# Patient Record
Sex: Female | Born: 1965 | Race: Black or African American | Hispanic: No | Marital: Single | Smoking: Former smoker
Health system: Southern US, Community
[De-identification: ages and names within clinical notes are randomized; demographics above are authoritative.]

## PROBLEM LIST (undated history)

## (undated) DIAGNOSIS — I10 Essential (primary) hypertension: Secondary | ICD-10-CM

## (undated) DIAGNOSIS — I639 Cerebral infarction, unspecified: Secondary | ICD-10-CM

## (undated) DIAGNOSIS — I219 Acute myocardial infarction, unspecified: Secondary | ICD-10-CM

## (undated) DIAGNOSIS — K219 Gastro-esophageal reflux disease without esophagitis: Secondary | ICD-10-CM

## (undated) DIAGNOSIS — M1990 Unspecified osteoarthritis, unspecified site: Secondary | ICD-10-CM

## (undated) HISTORY — PX: OTHER SURGICAL HISTORY: SHX169

## (undated) HISTORY — PX: TUBAL LIGATION: SHX77

---

## 2011-07-25 ENCOUNTER — Emergency Department: Payer: Self-pay | Admitting: Emergency Medicine

## 2014-04-13 ENCOUNTER — Emergency Department: Payer: Self-pay | Admitting: Emergency Medicine

## 2014-04-13 LAB — BASIC METABOLIC PANEL
Anion Gap: 8 (ref 7–16)
BUN: 10 mg/dL (ref 7–18)
CALCIUM: 9 mg/dL (ref 8.5–10.1)
CHLORIDE: 106 mmol/L (ref 98–107)
CREATININE: 0.93 mg/dL (ref 0.60–1.30)
Co2: 23 mmol/L (ref 21–32)
EGFR (Non-African Amer.): >60
Glucose: 96 mg/dL (ref 65–99)
Osmolality: 273 (ref 275–301)
Potassium: 4.2 mmol/L (ref 3.5–5.1)
Sodium: 137 mmol/L (ref 136–145)

## 2014-04-13 LAB — TROPONIN I
Troponin-I: <0.02 ng/mL
Troponin-I: <0.02 ng/mL

## 2014-04-13 LAB — CBC
HCT: 37.9 % (ref 35.0–47.0)
HGB: 12.3 g/dL (ref 12.0–16.0)
MCH: 30 pg (ref 26.0–34.0)
MCHC: 32.4 g/dL (ref 32.0–36.0)
MCV: 93 fL (ref 80–100)
PLATELETS: 391 10*3/uL (ref 150–440)
RBC: 4.1 10*6/uL (ref 3.80–5.20)
RDW: 14 % (ref 11.5–14.5)
WBC: 11.5 10*3/uL — AB (ref 3.6–11.0)

## 2014-05-12 ENCOUNTER — Emergency Department: Payer: Self-pay | Admitting: Emergency Medicine

## 2014-05-12 ENCOUNTER — Other Ambulatory Visit: Payer: Self-pay

## 2014-05-12 LAB — COMPREHENSIVE METABOLIC PANEL
ALBUMIN: 3.5 g/dL (ref 3.4–5.0)
ANION GAP: 6 — AB (ref 7–16)
AST: 21 U/L (ref 15–37)
Alkaline Phosphatase: 113 U/L
BUN: 14 mg/dL (ref 7–18)
Bilirubin,Total: 0.2 mg/dL (ref 0.2–1.0)
CO2: 29 mmol/L (ref 21–32)
Calcium, Total: 9 mg/dL (ref 8.5–10.1)
Chloride: 104 mmol/L (ref 98–107)
Creatinine: 0.9 mg/dL (ref 0.60–1.30)
EGFR (African American): >60
EGFR (Non-African Amer.): >60
GLUCOSE: 94 mg/dL (ref 65–99)
OSMOLALITY: 278 (ref 275–301)
Potassium: 3.9 mmol/L (ref 3.5–5.1)
SGPT (ALT): 15 U/L
Sodium: 139 mmol/L (ref 136–145)
Total Protein: 7.8 g/dL (ref 6.4–8.2)

## 2014-05-12 LAB — CBC
HCT: 33.7 % — ABNORMAL LOW (ref 35.0–47.0)
HGB: 10.8 g/dL — ABNORMAL LOW (ref 12.0–16.0)
MCH: 29.1 pg (ref 26.0–34.0)
MCHC: 32.2 g/dL (ref 32.0–36.0)
MCV: 91 fL (ref 80–100)
Platelet: 352 10*3/uL (ref 150–440)
RBC: 3.72 10*6/uL — ABNORMAL LOW (ref 3.80–5.20)
RDW: 13.2 % (ref 11.5–14.5)
WBC: 11.2 10*3/uL — ABNORMAL HIGH (ref 3.6–11.0)

## 2014-05-12 LAB — CK TOTAL AND CKMB (NOT AT ARMC)
CK, Total: 65 U/L (ref 26–192)
CK-MB: 0.6 ng/mL (ref 0.5–3.6)

## 2014-05-12 LAB — TROPONIN I: Troponin-I: <0.02 ng/mL

## 2014-11-18 ENCOUNTER — Emergency Department: Payer: Self-pay

## 2014-11-18 ENCOUNTER — Emergency Department
Admission: EM | Admit: 2014-11-18 | Discharge: 2014-11-18 | Disposition: A | Discharge status: Home / Self Care | Payer: Self-pay | Attending: Emergency Medicine | Admitting: Emergency Medicine

## 2014-11-18 ENCOUNTER — Other Ambulatory Visit: Payer: Self-pay

## 2014-11-18 DIAGNOSIS — R0789 Other chest pain: Secondary | ICD-10-CM | POA: Insufficient documentation

## 2014-11-18 DIAGNOSIS — G8929 Other chronic pain: Secondary | ICD-10-CM | POA: Insufficient documentation

## 2014-11-18 DIAGNOSIS — M25512 Pain in left shoulder: Secondary | ICD-10-CM | POA: Insufficient documentation

## 2014-11-18 LAB — BASIC METABOLIC PANEL
ANION GAP: 8 (ref 5–15)
BUN: 14 mg/dL (ref 6–20)
CHLORIDE: 108 mmol/L (ref 101–111)
CO2: 24 mmol/L (ref 22–32)
Calcium: 9.3 mg/dL (ref 8.9–10.3)
Creatinine, Ser: 0.89 mg/dL (ref 0.44–1.00)
Glucose, Bld: 118 mg/dL — ABNORMAL HIGH (ref 65–99)
Potassium: 3.2 mmol/L — ABNORMAL LOW (ref 3.5–5.1)
SODIUM: 140 mmol/L (ref 135–145)

## 2014-11-18 LAB — CBC
HCT: 27.3 % — ABNORMAL LOW (ref 35.0–47.0)
HEMOGLOBIN: 8.1 g/dL — AB (ref 12.0–16.0)
MCH: 22.4 pg — AB (ref 26.0–34.0)
MCHC: 29.8 g/dL — ABNORMAL LOW (ref 32.0–36.0)
MCV: 75.2 fL — AB (ref 80.0–100.0)
Platelets: 339 10*3/uL (ref 150–440)
RBC: 3.63 MIL/uL — AB (ref 3.80–5.20)
RDW: 17.5 % — ABNORMAL HIGH (ref 11.5–14.5)
WBC: 8 10*3/uL (ref 3.6–11.0)

## 2014-11-18 LAB — TROPONIN I

## 2014-11-18 MED ORDER — KETOROLAC TROMETHAMINE 30 MG/ML IJ SOLN
INTRAMUSCULAR | Status: AC
  Administered 2014-11-18: 30 mg via INTRAMUSCULAR
  Filled 2014-11-18: qty 1

## 2014-11-18 MED ORDER — NAPROXEN 500 MG PO TABS: 500.0000 mg | ORAL_TABLET | Freq: Two times a day (BID) | ORAL | Status: DC

## 2014-11-18 MED ORDER — KETOROLAC TROMETHAMINE 30 MG/ML IJ SOLN
30.0000 mg | Freq: Once | INTRAMUSCULAR | Status: AC
  Administered 2014-11-18: 30 mg via INTRAMUSCULAR

## 2014-11-18 NOTE — ED Notes (Signed)
MD at bedside. 

## 2014-11-18 NOTE — ED Provider Notes (Signed)
[facility] Emergency Department Provider Note  ____________________________________________  Time seen: On arrival  I have reviewed the triage vital signs and the nursing notes.   HISTORY  Chief Complaint Chest Pain; Rib Injury; and Shortness of Breath    HPI Samantha Wells is a 49 y.o. female who presents with complains of left sided chest discomfort that is worse with movement and taking deep breaths that began today. She notes she has been doing heavy lifting at work and she thinks this may have caused a muscle strain. She also reports a long history of left shoulder pain caused by a rotator cuff injury in the past. She denies diaphoresis. She has no shortness of breath. No cough. No fevers.    History reviewed. No pertinent past medical history.  There are no active problems to display for this patient.   History reviewed. No pertinent past surgical history.  Current Outpatient Rx  Name  Route  Sig  Dispense  Refill  . naproxen (NAPROSYN) 500 MG tablet   Oral   Take 1 tablet (500 mg total) by mouth 2 (two) times daily with a meal.   20 tablet   2     Allergies Review of patient's allergies indicates no known allergies.  No family history on file.  Social History History  Substance Use Topics  . Smoking status: Never Smoker   . Smokeless tobacco: Not on file  . Alcohol Use: No    Review of Systems  Constitutional: Negative for fever. Eyes: Negative for visual changes. ENT: Negative for sore throat Cardiovascular: Positive for chest pain Respiratory: Negative for shortness of breath. Gastrointestinal: Negative for abdominal pain, vomiting and diarrhea. Genitourinary: Negative for dysuria. Musculoskeletal: Negative for back pain. Positive for chronic left shoulder pain Skin: Negative for rash. Neurological: Negative for headaches or focal weakness   10-point ROS otherwise  negative.  ____________________________________________   PHYSICAL EXAM:  VITAL SIGNS: ED Triage Vitals  Enc Vitals Group     BP 11/18/14 1027 157/95 mmHg     Pulse Rate 11/18/14 1027 86     Resp 11/18/14 1027 20     Temp 11/18/14 1027 98.2 F (36.8 C)     Temp Source 11/18/14 1027 Oral     SpO2 11/18/14 1027 100 %     Weight 11/18/14 1027 160 lb (72.576 kg)     Height 11/18/14 1027 5\' 6"  (1.676 m)     Head Cir --      Peak Flow --      Pain Score 11/18/14 1027 10     Pain Loc --      Pain Edu? --      Excl. in GC? --      Constitutional: Alert and oriented. Well appearing and in no distress. Eyes: Conjunctivae are normal. PERRL. ENT   Head: Normocephalic and atraumatic.   Nose: No rhinnorhea.   Mouth/Throat: Mucous membranes are moist. Cardiovascular: Normal rate, regular rhythm. Normal and symmetric distal pulses are present in all extremities. No murmurs, rubs, or gallops. Respiratory: Normal respiratory effort without tachypnea nor retractions. Breath sounds are clear and equal bilaterally.  Gastrointestinal: Soft and non-tender in all quadrants. No distention. There is no CVA tenderness. Genitourinary: deferred Musculoskeletal: Nontender with normal range of motion in all extremities. No lower extremity tenderness nor edema. Patient with pain with active range of motion of left arm above 90. Also with passive motion. This is chronic pain patient reports. Normal pulses bilaterally Neurologic:  Normal  speech and language. No gross focal neurologic deficits are appreciated. Skin:  Skin is warm, dry and intact. No rash noted. Psychiatric: Mood and affect are normal. Patient exhibits appropriate insight and judgment.  ____________________________________________    LABS (pertinent positives/negatives)  Labs Reviewed  CBC - Abnormal; Notable for the following:    RBC 3.63 (*)    Hemoglobin 8.1 (*)    HCT 27.3 (*)    MCV 75.2 (*)    MCH 22.4 (*)    MCHC  29.8 (*)    RDW 17.5 (*)    All other components within normal limits  BASIC METABOLIC PANEL - Abnormal; Notable for the following:    Potassium 3.2 (*)    Glucose, Bld 118 (*)    All other components within normal limits  TROPONIN I    ____________________________________________   EKG  ED ECG REPORT I, Jene Every, the attending physician, personally viewed and interpreted this ECG.   Date: 11/18/2014  EKG Time: 10:29 AM  Rate: 96  Rhythm: normal EKG, normal sinus rhythm, prolonged QT interval  Axis: Normal  Intervals:none  ST&T Change: Normal   ____________________________________________    RADIOLOGY  Chest x-ray normal, reviewed by me  ____________________________________________   PROCEDURES  Procedure(s) performed: none  Critical Care performed: none  ____________________________________________   INITIAL IMPRESSION / ASSESSMENT AND PLAN / ED COURSE  Pertinent labs & imaging results that were available during my care of the patient were reviewed by me and considered in my medical decision making (see chart for details).  Patient overall well-appearing. This does appear to be more consistent with musculoskeletal chest wall pain. We'll check enzymes, blood work, chest x-ray, EKG   Patient feeling better after Toradol IM. Workup benign. History of present illness an exam consistent with muscular skeletal chest pain. She will follow-up with her PCP. _________________________________   FINAL CLINICAL IMPRESSION(S) / ED DIAGNOSES  Final diagnoses:  Chest wall pain     Jene Every, MD 11/18/14 1506

## 2014-11-18 NOTE — Discharge Instructions (Signed)

## 2014-11-18 NOTE — ED Notes (Signed)
Pt arrives from work c/o sharp left sided rib pain and chest pain accompanied with SOB due to chest pain that began today. Pt states that she has been lifting heavy things at work X 2 days and believes that pain is due to heavy lifting. Pt alert and oriented X4, active, cooperative, pt in NAD. RR even and unlabored, color WNL.

## 2015-01-20 DIAGNOSIS — M7552 Bursitis of left shoulder: Secondary | ICD-10-CM | POA: Insufficient documentation

## 2015-01-20 DIAGNOSIS — Z791 Long term (current) use of non-steroidal anti-inflammatories (NSAID): Secondary | ICD-10-CM | POA: Insufficient documentation

## 2015-01-20 DIAGNOSIS — M25512 Pain in left shoulder: Secondary | ICD-10-CM | POA: Diagnosis present

## 2015-01-20 DIAGNOSIS — I10 Essential (primary) hypertension: Secondary | ICD-10-CM | POA: Insufficient documentation

## 2015-01-21 ENCOUNTER — Emergency Department
Admission: EM | Admit: 2015-01-21 | Discharge: 2015-01-21 | Disposition: A | Discharge status: Home / Self Care | Payer: PRIVATE HEALTH INSURANCE | Attending: Emergency Medicine | Admitting: Emergency Medicine

## 2015-01-21 DIAGNOSIS — M7552 Bursitis of left shoulder: Secondary | ICD-10-CM

## 2015-01-21 LAB — CBC WITH DIFFERENTIAL/PLATELET
Basophils Absolute: 0 10*3/uL (ref 0–0.1)
Basophils Relative: 0 %
Eosinophils Absolute: 0.1 10*3/uL (ref 0–0.7)
Eosinophils Relative: 1 %
HEMATOCRIT: 28 % — AB (ref 35.0–47.0)
Hemoglobin: 8.4 g/dL — ABNORMAL LOW (ref 12.0–16.0)
LYMPHS ABS: 1.9 10*3/uL (ref 1.0–3.6)
LYMPHS PCT: 21 %
MCH: 21.3 pg — AB (ref 26.0–34.0)
MCHC: 30 g/dL — ABNORMAL LOW (ref 32.0–36.0)
MCV: 70.9 fL — ABNORMAL LOW (ref 80.0–100.0)
MONO ABS: 0.5 10*3/uL (ref 0.2–0.9)
Monocytes Relative: 6 %
NEUTROS ABS: 6.2 10*3/uL (ref 1.4–6.5)
Neutrophils Relative %: 72 %
PLATELETS: 274 10*3/uL (ref 150–440)
RBC: 3.95 MIL/uL (ref 3.80–5.20)
RDW: 20.1 % — AB (ref 11.5–14.5)
WBC: 8.7 10*3/uL (ref 3.6–11.0)

## 2015-01-21 LAB — COMPREHENSIVE METABOLIC PANEL
ALT: 12 U/L — AB (ref 14–54)
AST: 24 U/L (ref 15–41)
Albumin: 3.8 g/dL (ref 3.5–5.0)
Alkaline Phosphatase: 86 U/L (ref 38–126)
Anion gap: 5 (ref 5–15)
BUN: 16 mg/dL (ref 6–20)
CHLORIDE: 108 mmol/L (ref 101–111)
CO2: 25 mmol/L (ref 22–32)
Calcium: 8.9 mg/dL (ref 8.9–10.3)
Creatinine, Ser: 0.91 mg/dL (ref 0.44–1.00)
GFR calc non Af Amer: >60 mL/min (ref >60)
Glucose, Bld: 102 mg/dL — ABNORMAL HIGH (ref 65–99)
Potassium: 3.4 mmol/L — ABNORMAL LOW (ref 3.5–5.1)
Sodium: 138 mmol/L (ref 135–145)
Total Protein: 6.9 g/dL (ref 6.5–8.1)

## 2015-01-21 LAB — TROPONIN I

## 2015-01-21 MED ORDER — OXYCODONE-ACETAMINOPHEN 5-325 MG PO TABS: 1.0000 | ORAL_TABLET | ORAL | Status: DC | PRN

## 2015-01-21 MED ORDER — OXYCODONE-ACETAMINOPHEN 5-325 MG PO TABS
1.0000 | ORAL_TABLET | Freq: Once | ORAL | Status: AC
  Administered 2015-01-21: 1 via ORAL
  Filled 2015-01-21: qty 1

## 2015-01-21 MED ORDER — KETOROLAC TROMETHAMINE 30 MG/ML IJ SOLN
60.0000 mg | Freq: Once | INTRAMUSCULAR | Status: AC
  Administered 2015-01-21: 60 mg via INTRAMUSCULAR
  Filled 2015-01-21: qty 2

## 2015-01-21 MED ORDER — IBUPROFEN 800 MG PO TABS: 800.0000 mg | ORAL_TABLET | Freq: Three times a day (TID) | ORAL | Status: DC | PRN

## 2015-01-21 NOTE — ED Notes (Signed)
Pt. Going home with family. 

## 2015-01-21 NOTE — Discharge Instructions (Signed)
1. Take pain medicines as needed (Motrin/Percocet #15). 2. Wear sling as needed for comfort. You may remove to bathe and sleep. 3. Return to the ER for worsening symptoms, chest pain, difficulty breathing or other concerns.  Bursitis Bursitis is a swelling and soreness (inflammation) of a fluid-filled sac (bursa) that overlies and protects a joint. It can be caused by injury, overuse of the joint, arthritis or infection. The joints most likely to be affected are the elbows, shoulders, hips and knees. HOME CARE INSTRUCTIONS   Apply ice to the affected area for 15-20 minutes each hour while awake for 2 days. Put the ice in a plastic bag and place a towel between the bag of ice and your skin.  Rest the injured joint as much as possible, but continue to put the joint through a full range of motion, 4 times per day. (The shoulder joint especially becomes rapidly "frozen" if not used.) When the pain lessens, begin normal slow movements and usual activities.  Only take over-the-counter or prescription medicines for pain, discomfort or fever as directed by your caregiver.  Your caregiver may recommend draining the bursa and injecting medicine into the bursa. This may help the healing process.  Follow all instructions for follow-up with your caregiver. This includes any orthopedic referrals, physical therapy and rehabilitation. Any delay in obtaining necessary care could result in a delay or failure of the bursitis to heal and chronic pain. SEEK IMMEDIATE MEDICAL CARE IF:   Your pain increases even during treatment.  You develop an oral temperature above 102 F (38.9 C) and have heat and inflammation over the involved bursa. MAKE SURE YOU:   Understand these instructions.  Will watch your condition.  Will get help right away if you are not doing well or get worse. Document Released: 05/17/2000 Document Revised: 08/12/2011 Document Reviewed: 08/09/2013 [facility] Patient Information 2015  [place], [place]. This information is not intended to replace advice given to you by your health care provider. Make sure you discuss any questions you have with your health care provider.  Rotator Cuff Tendinitis  Rotator cuff tendinitis is inflammation of the tough, cord-like bands that connect muscle to bone (tendons) in your rotator cuff. Your rotator cuff is the collection of all the muscles and tendons that connect your arm to your shoulder. Your rotator cuff holds the head of your upper arm bone (humerus) in the cup (fossa) of your shoulder blade (scapula). CAUSES Rotator cuff tendinitis is usually caused by overusing the joint involved.  SIGNS AND SYMPTOMS  Deep ache in the shoulder also felt on the outside upper arm over the shoulder muscle.  Point tenderness over the area that is injured.  Pain comes on gradually and becomes worse with lifting the arm to the side (abduction) or turning it inward (internal rotation).  May lead to a chronic tear: When a rotator cuff tendon becomes inflamed, it runs the risk of losing its blood supply, causing some tendon fibers to die. This increases the risk that the tendon can fray and partially or completely tear. DIAGNOSIS Rotator cuff tendinitis is diagnosed by taking a medical history, performing a physical exam, and reviewing results of imaging exams. The medical history is useful to help determine the type of rotator cuff injury. The physical exam will include looking at the injured shoulder, feeling the injured area, and watching you do range-of-motion exercises. X-ray exams are typically done to rule out other causes of shoulder pain, such as fractures. MRI is the imaging exam  usually used for significant shoulder injuries. Sometimes a dye study called CT arthrogram is done, but it is not as widely used as MRI. In some institutions, special ultrasound tests may also be used to aid in the diagnosis. TREATMENT  Less Severe Cases  Use of a sling to  rest the shoulder for a short period of time. Prolonged use of the sling can cause stiffness, weakness, and loss of motion of the shoulder joint.  Anti-inflammatory medicines, such as ibuprofen or naproxen sodium, may be prescribed. More Severe Cases  Physical therapy.  Use of steroid injections into the shoulder joint.  Surgery. HOME CARE INSTRUCTIONS   Use a sling or splint until the pain decreases. Prolonged use of the sling can cause stiffness, weakness, and loss of motion of the shoulder joint.  Apply ice to the injured area:  Put ice in a plastic bag.  Place a towel between your skin and the bag.  Leave the ice on for 20 minutes, 2-3 times a day.  Try to avoid use other than gentle range of motion while your shoulder is painful. Use the shoulder and exercise only as directed by your health care provider. Stop exercises or range of motion if pain or discomfort increases, unless directed otherwise by your health care provider.  Only take over-the-counter or prescription medicines for pain, discomfort, or fever as directed by your health care provider.  If you were given a shoulder sling and straps (immobilizer), do not remove it except as directed, or until you see a health care provider for a follow-up exam. If you need to remove it, move your arm as little as possible or as directed.  You may want to sleep on several pillows at night to lessen swelling and pain. SEEK IMMEDIATE MEDICAL CARE IF:   Your shoulder pain increases or new pain develops in your arm, hand, or fingers and is not relieved with medicines.  You have new, unexplained symptoms, especially increased numbness in the hands or loss of strength.  You develop any worsening of the problems that brought you in for care.  Your arm, hand, or fingers are numb or tingling.  Your arm, hand, or fingers are swollen, painful, or turn white or blue. MAKE SURE YOU:  Understand these instructions.  Will watch your  condition.  Will get help right away if you are not doing well or get worse. Document Released: 08/10/2003 Document Revised: 03/10/2013 Document Reviewed: 12/30/2012 [facility] Patient Information 2015 [place], [place]. This information is not intended to replace advice given to you by your health care provider. Make sure you discuss any questions you have with your health care provider.

## 2015-01-21 NOTE — ED Notes (Signed)
Pt states left shoulder pain that began at 2300 yesterday. Pt denies chest pain, dizziness, nausea, shob, diaphoresis. Pt states was in bed when pain woke her from sleep. Pt with pain on palpation of shoulder, cms intact to fingers.
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
## 2015-02-27 ENCOUNTER — Encounter (HOSPITAL_COMMUNITY): Payer: Self-pay | Admitting: Emergency Medicine

## 2015-02-27 ENCOUNTER — Emergency Department (HOSPITAL_COMMUNITY): Payer: PRIVATE HEALTH INSURANCE

## 2015-02-27 ENCOUNTER — Other Ambulatory Visit: Payer: Self-pay

## 2015-02-27 ENCOUNTER — Emergency Department (HOSPITAL_COMMUNITY)
Admission: EM | Admit: 2015-02-27 | Discharge: 2015-02-27 | Disposition: A | Discharge status: Home / Self Care | Payer: PRIVATE HEALTH INSURANCE | Attending: Emergency Medicine | Admitting: Emergency Medicine

## 2015-02-27 DIAGNOSIS — R319 Hematuria, unspecified: Secondary | ICD-10-CM | POA: Diagnosis not present

## 2015-02-27 DIAGNOSIS — Z7982 Long term (current) use of aspirin: Secondary | ICD-10-CM | POA: Insufficient documentation

## 2015-02-27 DIAGNOSIS — Z79899 Other long term (current) drug therapy: Secondary | ICD-10-CM | POA: Diagnosis not present

## 2015-02-27 DIAGNOSIS — R809 Proteinuria, unspecified: Secondary | ICD-10-CM | POA: Insufficient documentation

## 2015-02-27 DIAGNOSIS — R55 Syncope and collapse: Secondary | ICD-10-CM | POA: Diagnosis present

## 2015-02-27 DIAGNOSIS — Z720 Tobacco use: Secondary | ICD-10-CM | POA: Insufficient documentation

## 2015-02-27 LAB — URINE MICROSCOPIC-ADD ON

## 2015-02-27 LAB — BASIC METABOLIC PANEL
Anion gap: 8 (ref 5–15)
BUN: 12 mg/dL (ref 6–20)
CALCIUM: 9.4 mg/dL (ref 8.9–10.3)
CO2: 25 mmol/L (ref 22–32)
CREATININE: 0.98 mg/dL (ref 0.44–1.00)
Chloride: 104 mmol/L (ref 101–111)
GFR calc Af Amer: >60 mL/min (ref >60)
GFR calc non Af Amer: >60 mL/min (ref >60)
Glucose, Bld: 88 mg/dL (ref 65–99)
Potassium: 3.7 mmol/L (ref 3.5–5.1)
Sodium: 137 mmol/L (ref 135–145)

## 2015-02-27 LAB — CBC WITH DIFFERENTIAL/PLATELET
Basophils Absolute: 0 10*3/uL (ref 0.0–0.1)
Basophils Relative: 0 %
Eosinophils Absolute: 0 10*3/uL (ref 0.0–0.7)
Eosinophils Relative: 0 %
HCT: 30.2 % — ABNORMAL LOW (ref 36.0–46.0)
HEMOGLOBIN: 9.1 g/dL — AB (ref 12.0–15.0)
Lymphocytes Relative: 15 %
Lymphs Abs: 1.3 10*3/uL (ref 0.7–4.0)
MCH: 23.2 pg — ABNORMAL LOW (ref 26.0–34.0)
MCHC: 30.1 g/dL (ref 30.0–36.0)
MCV: 76.8 fL — ABNORMAL LOW (ref 78.0–100.0)
MONO ABS: 0.4 10*3/uL (ref 0.1–1.0)
MONOS PCT: 4 %
NEUTROS PCT: 81 %
Neutro Abs: 7.2 10*3/uL (ref 1.7–7.7)
Platelets: 277 10*3/uL (ref 150–400)
RBC: 3.93 MIL/uL (ref 3.87–5.11)
RDW: 20 % — AB (ref 11.5–15.5)
WBC: 9 10*3/uL (ref 4.0–10.5)

## 2015-02-27 LAB — URINALYSIS, ROUTINE W REFLEX MICROSCOPIC
Bilirubin Urine: NEGATIVE
GLUCOSE, UA: NEGATIVE mg/dL
KETONES UR: NEGATIVE mg/dL
NITRITE: NEGATIVE
PH: 7.5 (ref 5.0–8.0)
Protein, ur: 100 mg/dL — AB
SPECIFIC GRAVITY, URINE: 1.016 (ref 1.005–1.030)
Urobilinogen, UA: 1 mg/dL (ref 0.0–1.0)

## 2015-02-27 LAB — CBG MONITORING, ED: Glucose-Capillary: 85 mg/dL (ref 65–99)

## 2015-02-27 LAB — I-STAT TROPONIN, ED
TROPONIN I, POC: 0 ng/mL (ref 0.00–0.08)
TROPONIN I, POC: 0 ng/mL (ref 0.00–0.08)

## 2015-02-27 MED ORDER — SODIUM CHLORIDE 0.9 % IV SOLN
1000.0000 mL | Freq: Once | INTRAVENOUS | Status: AC
  Administered 2015-02-27: 1000 mL via INTRAVENOUS

## 2015-02-27 MED ORDER — ACETAMINOPHEN 500 MG PO TABS
1000.0000 mg | ORAL_TABLET | Freq: Once | ORAL | Status: AC
  Administered 2015-02-27: 1000 mg via ORAL
  Filled 2015-02-27: qty 2

## 2015-02-27 MED ORDER — LORAZEPAM 1 MG PO TABS
0.5000 mg | ORAL_TABLET | Freq: Once | ORAL | Status: AC
  Administered 2015-02-27: 0.5 mg via ORAL
  Filled 2015-02-27: qty 1

## 2015-02-27 NOTE — ED Notes (Signed)
Fall risk band placed on pt., pt. Refused yellow socks. Pt. With shoes on. Bed low and locked, call bell in reach.

## 2015-02-27 NOTE — ED Notes (Signed)
Per EMS pt was at work when she experienced a loss of consciousness; EMS reports pt colleagues did not witness her hit her head of have a ny seizures; pt reports dizziness before syncope ; pt presents co pain on left side near ribcage; pt reports she did not fall but rather sat down when she felt the dizziness; denies hx of DM or cardiac related issues

## 2015-02-27 NOTE — Discharge Instructions (Signed)
Your work up showed no abnormalities. You did have a small amount of microscopic blood in your urine and some protein. You will need to follow up with your primary care doctor to evaluate this. Please follow up closely with your doctor for further evaluation of your event. Read the information below carefully to learn more about passing out.  Syncope Syncope is a medical term for fainting or passing out. This means you lose consciousness and drop to the ground. People are generally unconscious for less than 5 minutes. You may have some muscle twitches for up to 15 seconds before waking up and returning to normal. Syncope occurs more often in older adults, but it can happen to anyone. While most causes of syncope are not dangerous, syncope can be a sign of a serious medical problem. It is important to seek medical care.  CAUSES  Syncope is caused by a sudden drop in blood flow to the brain. The specific cause is often not determined. Factors that can bring on syncope include:  Taking medicines that lower blood pressure.  Sudden changes in posture, such as standing up quickly.  Taking more medicine than prescribed.  Standing in one place for too long.  Seizure disorders.  Dehydration and excessive exposure to heat.  Low blood sugar (hypoglycemia).  Straining to have a bowel movement.  Heart disease, irregular heartbeat, or other circulatory problems.  Fear, emotional distress, seeing blood, or severe pain. SYMPTOMS  Right before fainting, you may:  Feel dizzy or light-headed.  Feel nauseous.  See all white or all black in your field of vision.  Have cold, clammy skin. DIAGNOSIS  Your health care provider will ask about your symptoms, perform a physical exam, and perform an electrocardiogram (ECG) to record the electrical activity of your heart. Your health care provider may also perform other heart or blood tests to determine the cause of your syncope which may  include:  Transthoracic echocardiogram (TTE). During echocardiography, sound waves are used to evaluate how blood flows through your heart.  Transesophageal echocardiogram (TEE).  Cardiac monitoring. This allows your health care provider to monitor your heart rate and rhythm in real time.  Holter monitor. This is a portable device that records your heartbeat and can help diagnose heart arrhythmias. It allows your health care provider to track your heart activity for several days, if needed.  Stress tests by exercise or by giving medicine that makes the heart beat faster. TREATMENT  In most cases, no treatment is needed. Depending on the cause of your syncope, your health care provider may recommend changing or stopping some of your medicines. HOME CARE INSTRUCTIONS  Have someone stay with you until you feel stable.  Do not drive, use machinery, or play sports until your health care provider says it is okay.  Keep all follow-up appointments as directed by your health care provider.  Lie down right away if you start feeling like you might faint. Breathe deeply and steadily. Wait until all the symptoms have passed.  Drink enough fluids to keep your urine clear or pale yellow.  If you are taking blood pressure or heart medicine, get up slowly and take several minutes to sit and then stand. This can reduce dizziness. SEEK IMMEDIATE MEDICAL CARE IF:   You have a severe headache.  You have unusual pain in the chest, abdomen, or back.  You are bleeding from your mouth or rectum, or you have black or tarry stool.  You have an irregular or very  fast heartbeat.  You have pain with breathing.  You have repeated fainting or seizure-like jerking during an episode.  You faint when sitting or lying down.  You have confusion.  You have trouble walking.  You have severe weakness.  You have vision problems. If you fainted, call your local emergency services (911 in [place] Do not drive  yourself to the hospital.  MAKE SURE YOU:  Understand these instructions.  Will watch your condition.  Will get help right away if you are not doing well or get worse. Document Released: 05/20/2005 Document Revised: 05/25/2013 Document Reviewed: 07/19/2011 [facility] Patient Information 2015 [place], [place]. This information is not intended to replace advice given to you by your health care provider. Make sure you discuss any questions you have with your health care provider.  Proteinuria Proteinuria is a condition in which urine contains more protein than is normal. Proteinuria is either a sign that your body is producing too much protein or a sign that there is a problem with the kidneys. Healthy kidneys prevent most substances that the body needs, including proteins, from leaving the bloodstream and ending up in urine. CAUSES  Proteinuria may be caused by a temporary event or condition such as stress, exercise, or fever, and go away on its own. Proteinuria may also be a symptom of a more serious condition or disease. Causes of proteinuria include:  A kidney disease caused by:  Diabetes.  High blood pressure (hypertension).   A disease that affects the immune system, such as lupus.  A genetic disease, such as Alport's syndrome.  Medicines that damage the kidneys, such as long-term nonsteroidal anti-inflammatory drugs (NSAIDs).  Poisoning or exposure to toxic substances.  A reoccurring kidney or urinary infection.  Excess protein production in the body caused by:  Multiple myeloma.  Amyloidosis. SYMPTOMS You may have proteinuria without having noticeable symptoms. If there is a large amount of protein in your urine, your urine may look foamy. You may also notice swelling (edema) in your hands, feet, abdomen, or face. DIAGNOSIS To determine whether you have proteinuria, you will need to provide a urine sample. Your urine will then be tested for too much protein and the main  blood protein albumin. If your test shows that you have proteinuria, you may need to take additional tests to determine its cause, how much protein is in your urine, and what type of protein is being lost. Tests may include:  Blood tests.  Urine tests.  A blood pressure measurement.  Imaging tests. TREATMENT  Treatment will depend on the cause of your proteinuria. Your caregiver will discuss treatment options with you after you have been diagnosed. If your proteinuria is mild or temporary, no treatment may be necessary. HOME CARE INSTRUCTIONS Ask your caregiver if monitoring the level of protein in your urine at home using simple testing strips is appropriate for you. Early detection of proteinuria can lead to early and often successful treatment of the condition causing it. Document Released: 07/10/2005 Document Revised: 02/12/2012 Document Reviewed: 10/18/2011 [facility] Patient Information 2015 [place], [place]. This information is not intended to replace advice given to you by your health care provider. Make sure you discuss any questions you have with your health care provider.  Hematuria Hematuria is blood in your urine. It can be caused by a bladder infection, kidney infection, prostate infection, kidney stone, or cancer of your urinary tract. Infections can usually be treated with medicine, and a kidney stone usually will pass through your urine. If  neither of these is the cause of your hematuria, further workup to find out the reason may be needed. It is very important that you tell your health care provider about any blood you see in your urine, even if the blood stops without treatment or happens without causing pain. Blood in your urine that happens and then stops and then happens again can be a symptom of a very serious condition. Also, pain is not a symptom in the initial stages of many urinary cancers. HOME CARE INSTRUCTIONS   Drink lots of fluid, 3-4 quarts a day. If you have been  diagnosed with an infection, cranberry juice is especially recommended, in addition to large amounts of water.  Avoid caffeine, tea, and carbonated beverages because they tend to irritate the bladder.  Avoid alcohol because it may irritate the prostate.  Take all medicines as directed by your health care provider.  If you were prescribed an antibiotic medicine, finish it all even if you start to feel better.  If you have been diagnosed with a kidney stone, follow your health care provider's instructions regarding straining your urine to catch the stone.  Empty your bladder often. Avoid holding urine for long periods of time.  After a bowel movement, women should cleanse front to back. Use each tissue only once.  Empty your bladder before and after sexual intercourse if you are a female. SEEK MEDICAL CARE IF:  You develop back pain.  You have a fever.  You have a feeling of sickness in your stomach (nausea) or vomiting.  Your symptoms are not better in 3 days. Return sooner if you are getting worse. SEEK IMMEDIATE MEDICAL CARE IF:   You develop severe vomiting and are unable to keep the medicine down.  You develop severe back or abdominal pain despite taking your medicines.  You begin passing a large amount of blood or clots in your urine.  You feel extremely weak or faint, or you pass out. MAKE SURE YOU:   Understand these instructions.  Will watch your condition.  Will get help right away if you are not doing well or get worse. Document Released: 05/20/2005 Document Revised: 10/04/2013 Document Reviewed: 01/18/2013 [facility] Patient Information 2015 [place], [place]. This information is not intended to replace advice given to you by your health care provider. Make sure you discuss any questions you have with your health care provider.

## 2015-02-27 NOTE — ED Provider Notes (Signed)
CSN: [id number]     Arrival date & time 02/27/15  1102 History   First MD Initiated Contact with Patient 02/27/15 1112     Chief Complaint  Patient presents with  . Loss of Consciousness     (Consider location/radiation/quality/duration/timing/severity/associated sxs/prior Treatment) HPI   Samantha Wells is a(n) 49 y.o. female who presents to the ED with CC of Presyncope. 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 She states that she go warm and sweaty and had to sit down. She closed her eyes. She states that she did not loose consciousness during the event, but felt to weak to respond to her coworkers who were calling her name. She denies cp, sob, nausea, diaphoresis. She c/o of Left ribcage pain, however this is a longstanding problem and did not occur with today's incident. She denies hitting her head or loosing consciousness. No previous hx of syncope. She denies palpitations, blood loss, melena, hematochezia, recent volume loss through sweat, diarrhea or vomiting. She states that she is feeling better.   History reviewed. No pertinent past medical history. Past Surgical History  Procedure Laterality Date  . Left shoulder repair Left    No family history on file. Social History  Substance Use Topics  . Smoking status: Current Every Day Smoker    Types: Cigarettes  . Smokeless tobacco: None  . Alcohol Use: No   OB History    No data available     Review of Systems  Ten systems reviewed and are negative for acute change, except as noted in the HPI.    Allergies  Review of patient's allergies indicates no known allergies.  Home Medications   Prior to Admission medications   Medication Sig Start Date End Date Taking? Authorizing Provider  aspirin 81 MG tablet Take 81 mg by mouth daily.   Yes Historical Provider, MD  Calcium Carb-Cholecalciferol (OS-CAL) 500-600 MG-UNIT CHEW Chew 1 tablet by mouth daily.   Yes  Historical Provider, MD  ranitidine (ZANTAC) 150 MG tablet Take 150 mg by mouth 2 (two) times daily.   Yes Historical Provider, MD  naproxen (NAPROSYN) 500 MG tablet Take 1 tablet (500 mg total) by mouth 2 (two) times daily with a meal. 11/18/14   Jene Every, MD   BP 130/71 mmHg  Pulse 81  Temp(Src) 98.2 F (36.8 C) (Oral)  Resp 15  SpO2 100%  LMP 02/23/2015 Physical Exam  Constitutional: She is oriented to person, place, and time. She appears well-developed and well-nourished. No distress.  HENT:  Head: Normocephalic and atraumatic.  Mouth/Throat: Oropharynx is clear and moist.  Eyes: Conjunctivae and EOM are normal. Pupils are equal, round, and reactive to light. No scleral icterus.  No horizontal, vertical or rotational nystagmus  Neck: Normal range of motion. Neck supple.  Full active and passive ROM without pain No midline or paraspinal tenderness No nuchal rigidity or meningeal signs  Cardiovascular: Normal rate, regular rhythm, normal heart sounds and intact distal pulses.  Exam reveals no gallop and no friction rub.   No murmur heard. Pulmonary/Chest: Effort normal and breath sounds normal. No respiratory distress. She has no wheezes. She has no rales.  Abdominal: Soft. Bowel sounds are normal. She exhibits no distension and no mass. There is no tenderness. There is no rebound and no guarding.  Musculoskeletal: Normal range of motion.  Lymphadenopathy:    She has no cervical adenopathy.  Neurological: She is alert and oriented to  person, place, and time. She has normal reflexes. No cranial nerve deficit. She exhibits normal muscle tone. Coordination normal.  Mental Status:  Alert, oriented, thought content appropriate. Speech fluent without evidence of aphasia. Able to follow 2 step commands without difficulty.  Cranial Nerves:  II:  Peripheral visual fields grossly normal, pupils equal, round, reactive to light III,IV, VI: ptosis not present, extra-ocular motions intact  bilaterally  V,VII: smile symmetric, facial light touch sensation equal VIII: hearing grossly normal bilaterally  IX,X: midline uvula rise  XI: bilateral shoulder shrug equal and strong XII: midline tongue extension  Motor:  5/5 in upper and lower extremities bilaterally including strong and equal grip strength and dorsiflexion/plantar flexion Sensory: Pinprick and light touch normal in all extremities.  Deep Tendon Reflexes: 2+ and symmetric  Cerebellar: normal finger-to-nose with bilateral upper extremities Gait: normal gait and balance CV: distal pulses palpable throughout   Skin: Skin is warm and dry. No rash noted. She is not diaphoretic.  Psychiatric: She has a normal mood and affect. Her behavior is normal. Judgment and thought content normal.  Nursing note and vitals reviewed.   ED Course  Procedures (including critical care time) Labs Review Labs Reviewed  CBC WITH DIFFERENTIAL/PLATELET - Abnormal; Notable for the following:    Hemoglobin 9.1 (*)    HCT 30.2 (*)    MCV 76.8 (*)    MCH 23.2 (*)    RDW 20.0 (*)    All other components within normal limits  URINALYSIS, ROUTINE W REFLEX MICROSCOPIC (NOT AT [facility]) - Abnormal; Notable for the following:    Color, Urine RED (*)    APPearance TURBID (*)    Hgb urine dipstick LARGE (*)    Protein, ur 100 (*)    Leukocytes, UA SMALL (*)    All other components within normal limits  URINE MICROSCOPIC-ADD ON - Abnormal; Notable for the following:    Squamous Epithelial / LPF FEW (*)    Bacteria, UA FEW (*)    All other components within normal limits  BASIC METABOLIC PANEL  POCT CBG (FASTING - GLUCOSE)-MANUAL ENTRY  I-STAT TROPOININ, ED  CBG MONITORING, ED    Imaging Review Dg Chest 2 View  02/27/2015   CLINICAL DATA:  Weakness and dizziness this morning.  EXAM: CHEST  2 VIEW  COMPARISON:  11/18/2014 and 04/13/2014  FINDINGS: The heart size and mediastinal contours are within normal limits. Both lungs are clear. The  visualized skeletal structures are unremarkable.  IMPRESSION: Normal chest.   Electronically Signed   By: Francene Boyers M.D.   On: 02/27/2015 12:42   Ct Head Wo Contrast  02/27/2015   CLINICAL DATA:  Sudden onset of weakness and total body numbness.  EXAM: CT HEAD WITHOUT CONTRAST  TECHNIQUE: Contiguous axial images were obtained from the base of the skull through the vertex without intravenous contrast.  COMPARISON:  None.  FINDINGS: Bony calvarium appears intact. No mass effect or midline shift is noted. Ventricular size is within normal limits. There is no evidence of mass lesion, hemorrhage or acute infarction.  IMPRESSION: Normal head CT.   Electronically Signed   By: Lupita Raider, M.D.   On: 02/27/2015 13:22   I have personally reviewed and evaluated these images and lab results as part of my medical decision-making.   EKG Interpretation   Date/Time:  Monday February 27 2015 11:01:47 EDT Ventricular Rate:  84 PR Interval:  156 QRS Duration: 98 QT Interval:  419 QTC Calculation: 495 R Axis:  61 Text Interpretation:  Sinus rhythm Probable left ventricular hypertrophy  Borderline prolonged QT interval No significant change since last tracing  Confirmed by POLLINA  MD, CHRISTOPHER [phone]) on 02/27/2015 1:25:50 PM      MDM   Final diagnoses:  Syncope, unspecified syncope type  Hematuria  Proteinuria    Abnormal urine without signs of infection. (hemturia and proteinuria) Baseline microcytic anemia. Negative head CT. No prolonged QT (although borderline)  Negative orthostatics. Normal CXR Labs, imaging, ekg are reassuring.  Patient feels improved after medication and fluids. She appears safe for discharge. Likely vasovagal event.    Arthor Captain, PA-C 03/01/15 [id number]  Gilda Crease, MD 03/02/15 [phone]

## 2015-07-19 ENCOUNTER — Emergency Department: Payer: PRIVATE HEALTH INSURANCE

## 2015-07-19 ENCOUNTER — Encounter: Payer: Self-pay | Admitting: Emergency Medicine

## 2015-07-19 ENCOUNTER — Emergency Department
Admission: EM | Admit: 2015-07-19 | Discharge: 2015-07-19 | Disposition: A | Discharge status: Home / Self Care | Payer: PRIVATE HEALTH INSURANCE | Attending: Emergency Medicine | Admitting: Emergency Medicine

## 2015-07-19 DIAGNOSIS — R42 Dizziness and giddiness: Secondary | ICD-10-CM | POA: Insufficient documentation

## 2015-07-19 DIAGNOSIS — R079 Chest pain, unspecified: Secondary | ICD-10-CM | POA: Insufficient documentation

## 2015-07-19 DIAGNOSIS — Z7982 Long term (current) use of aspirin: Secondary | ICD-10-CM | POA: Insufficient documentation

## 2015-07-19 DIAGNOSIS — R61 Generalized hyperhidrosis: Secondary | ICD-10-CM | POA: Insufficient documentation

## 2015-07-19 DIAGNOSIS — F17210 Nicotine dependence, cigarettes, uncomplicated: Secondary | ICD-10-CM | POA: Insufficient documentation

## 2015-07-19 DIAGNOSIS — R112 Nausea with vomiting, unspecified: Secondary | ICD-10-CM | POA: Insufficient documentation

## 2015-07-19 DIAGNOSIS — R109 Unspecified abdominal pain: Secondary | ICD-10-CM | POA: Insufficient documentation

## 2015-07-19 DIAGNOSIS — Z791 Long term (current) use of non-steroidal anti-inflammatories (NSAID): Secondary | ICD-10-CM | POA: Insufficient documentation

## 2015-07-19 DIAGNOSIS — Z79899 Other long term (current) drug therapy: Secondary | ICD-10-CM | POA: Insufficient documentation

## 2015-07-19 DIAGNOSIS — R0602 Shortness of breath: Secondary | ICD-10-CM | POA: Insufficient documentation

## 2015-07-19 LAB — FIBRIN DERIVATIVES D-DIMER (ARMC ONLY): FIBRIN DERIVATIVES D-DIMER (ARMC): 254 (ref 0–499)

## 2015-07-19 LAB — BASIC METABOLIC PANEL
Anion gap: 5 (ref 5–15)
BUN: 16 mg/dL (ref 6–20)
CHLORIDE: 107 mmol/L (ref 101–111)
CO2: 29 mmol/L (ref 22–32)
CREATININE: 0.91 mg/dL (ref 0.44–1.00)
Calcium: 9.5 mg/dL (ref 8.9–10.3)
Glucose, Bld: 92 mg/dL (ref 65–99)
POTASSIUM: 4.4 mmol/L (ref 3.5–5.1)
SODIUM: 141 mmol/L (ref 135–145)

## 2015-07-19 LAB — CBC
HEMATOCRIT: 39.9 % (ref 35.0–47.0)
HEMOGLOBIN: 13.1 g/dL (ref 12.0–16.0)
MCH: 29 pg (ref 26.0–34.0)
MCHC: 32.8 g/dL (ref 32.0–36.0)
MCV: 88.2 fL (ref 80.0–100.0)
PLATELETS: 244 10*3/uL (ref 150–440)
RBC: 4.52 MIL/uL (ref 3.80–5.20)
RDW: 15.1 % — ABNORMAL HIGH (ref 11.5–14.5)
WBC: 8.6 10*3/uL (ref 3.6–11.0)

## 2015-07-19 LAB — TROPONIN I: Troponin I: <0.03 ng/mL (ref <0.031)

## 2015-07-19 MED ORDER — ONDANSETRON HCL 4 MG/2ML IJ SOLN
4.0000 mg | Freq: Once | INTRAMUSCULAR | Status: AC
  Administered 2015-07-19: 4 mg via INTRAVENOUS
  Filled 2015-07-19: qty 2

## 2015-07-19 MED ORDER — NITROGLYCERIN 0.4 MG SL SUBL
0.4000 mg | SUBLINGUAL_TABLET | SUBLINGUAL | Status: DC | PRN
  Administered 2015-07-19: 0.4 mg via SUBLINGUAL
  Filled 2015-07-19: qty 1

## 2015-07-19 MED ORDER — ASPIRIN 81 MG PO CHEW
324.0000 mg | CHEWABLE_TABLET | Freq: Once | ORAL | Status: AC
  Administered 2015-07-19: 324 mg via ORAL
  Filled 2015-07-19: qty 4

## 2015-07-19 MED ORDER — MORPHINE SULFATE (PF) 4 MG/ML IV SOLN
4.0000 mg | Freq: Once | INTRAVENOUS | Status: AC
  Administered 2015-07-19: 4 mg via INTRAVENOUS
  Filled 2015-07-19: qty 1

## 2015-07-19 NOTE — ED Provider Notes (Signed)
[facility] Emergency Department Provider Note  ____________________________________________  Time seen: Approximately 550 AM  I have reviewed the triage vital signs and the nursing notes.   HISTORY  Chief Complaint Chest Pain    HPI Samantha Wells is a 50 y.o. female who comes into the hospital today with chest pain. The patient reports that the pain started around 4:30 AM. The patient got up and went to the bathroom and reports that when she was walking to and from the bathroom she felt dizzy. She said she started in the hallway and then when she sat down and started having some left-sided sharp chest pain. The patient reports the pain radiates to her shoulder. She did not take anything for pain and reports that her fingers occasionally swell. The patient reports that she's had some shortness of breath dizziness nausea and vomiting with some sweats and mild abdominal pain. The patient denies having this pain before and reports that the pain is a 10 out of 10 in intensity. She does report that the pain seems worse when she takes a deep breath. She did have some mild cough for the last few days as well. The patient is here for evaluation.   History reviewed. No pertinent past medical history.  There are no active problems to display for this patient.   Past Surgical History  Procedure Laterality Date  . Left shoulder repair Left     Current Outpatient Rx  Name  Route  Sig  Dispense  Refill  . aspirin 81 MG tablet   Oral   Take 81 mg by mouth daily.         . Calcium Carb-Cholecalciferol (OS-CAL) 500-600 MG-UNIT CHEW   Oral   Chew 1 tablet by mouth daily.         . naproxen (NAPROSYN) 500 MG tablet   Oral   Take 1 tablet (500 mg total) by mouth 2 (two) times daily with a meal.   20 tablet   2   . ranitidine (ZANTAC) 150 MG tablet   Oral   Take 150 mg by mouth 2 (two) times daily.           Allergies Review of patient's allergies  indicates no known allergies.  No family history on file.  Social History Social History  Substance Use Topics  . Smoking status: Current Every Day Smoker    Types: Cigarettes  . Smokeless tobacco: None  . Alcohol Use: No    Review of Systems Constitutional: Sweats with No fever/chills Eyes: No visual changes. ENT: No sore throat. Cardiovascular:  chest pain. Respiratory:  shortness of breath. Gastrointestinal: Abdominal pain, nausea, vomiting Genitourinary: Negative for dysuria. Musculoskeletal: Negative for back pain. Skin: Negative for rash. Neurological: Dizziness  10-point ROS otherwise negative.  ____________________________________________   PHYSICAL EXAM:  VITAL SIGNS: ED Triage Vitals  Enc Vitals Group     BP 07/19/15 0530 168/101 mmHg     Pulse Rate 07/19/15 0530 71     Resp 07/19/15 0530 18     Temp 07/19/15 0530 97.8 F (36.6 C)     Temp Source 07/19/15 0530 Oral     SpO2 07/19/15 0530 100 %     Weight 07/19/15 0528 168 lb (76.204 kg)     Height 07/19/15 0528  (1.626 m)     Head Cir --      Peak Flow --      Pain Score 07/19/15 0528 10     Pain  Loc --      Pain Edu? --      Excl. in GC? --     Constitutional: Alert and oriented. Ill appearing and in moderate distress. Eyes: Conjunctivae are normal. PERRL. EOMI. Head: Atraumatic. Nose: No congestion/rhinnorhea. Mouth/Throat: Mucous membranes are moist.  Oropharynx non-erythematous. Cardiovascular: Normal rate, regular rhythm. Grossly normal heart sounds.  Good peripheral circulation. Respiratory: Normal respiratory effort.  No retractions. Lungs CTAB. Gastrointestinal: Soft and nontender. No distention. Positive bowel sounds Musculoskeletal: No lower extremity tenderness nor edema.   Neurologic:  Normal speech and language.  Skin:  Skin is warm, dry and intact.  Psychiatric: Mood and affect are normal.   ____________________________________________   LABS (all labs ordered are  listed, but only abnormal results are displayed)  Labs Reviewed  CBC - Abnormal; Notable for the following:    RDW 15.1 (*)    All other components within normal limits  BASIC METABOLIC PANEL  TROPONIN I  FIBRIN DERIVATIVES D-DIMER ([facility] ONLY)  TROPONIN I   ____________________________________________  EKG  ED ECG REPORT I, Rebecka Apley, the attending physician, personally viewed and interpreted this ECG.   Date: 07/19/2015  EKG Time: 527  Rate: 70  Rhythm: normal sinus rhythm  Axis: Normal  Intervals:none  ST&T Change: normal  ____________________________________________  RADIOLOGY  CXR: No active cardiopulmonary disease ____________________________________________   PROCEDURES  Procedure(s) performed: None  Critical Care performed: No  ____________________________________________   INITIAL IMPRESSION / ASSESSMENT AND PLAN / ED COURSE  Pertinent labs & imaging results that were available during my care of the patient were reviewed by me and considered in my medical decision making (see chart for details).  This is a 50 year old female who comes into the hospital today with some chest pain. The patient does report that the pain radiates and she has multiple other associated signs and symptoms. I'll give the patient a full dose aspirin as well as some nitroglycerin 3 doses and repeat the patient's troponin is dizziness but is unremarkable.  The patient's d-dimer is unremarkable. I will repeat the patient's troponin at 8:30 to evaluate for heart disease. The patient's pain is currently improved. The patient's care will be signed out to Dr. Sharma Covert who will follow up the results of the repeat troponin and reassess the patient.  ____________________________________________   FINAL CLINICAL IMPRESSION(S) / ED DIAGNOSES  Final diagnoses:  Chest pain, unspecified chest pain type      Rebecka Apley, MD 07/19/15 [phone]

## 2015-07-19 NOTE — Discharge Instructions (Signed)
Please return to the emergency department if you develop chest pain, palpitations, shortness of breath, lightheadedness or fainting, or any other symptoms concerning to you.

## 2015-07-19 NOTE — ED Notes (Signed)
Pt in with co chest pain and cough for a few days, chest pain worse when she takes a deep breath.  States has had a fever at home, no hx of lung or heart disease.

## 2015-07-19 NOTE — ED Notes (Signed)
Second troponin collected and sent to lab per order. Nad. Pt resting at this time with family at bedside.

## 2015-09-23 ENCOUNTER — Emergency Department
Admission: EM | Admit: 2015-09-23 | Discharge: 2015-09-23 | Disposition: A | Discharge status: Home / Self Care | Payer: PRIVATE HEALTH INSURANCE | Attending: Emergency Medicine | Admitting: Emergency Medicine

## 2015-09-23 DIAGNOSIS — Y929 Unspecified place or not applicable: Secondary | ICD-10-CM | POA: Insufficient documentation

## 2015-09-23 DIAGNOSIS — X58XXXA Exposure to other specified factors, initial encounter: Secondary | ICD-10-CM | POA: Insufficient documentation

## 2015-09-23 DIAGNOSIS — Y939 Activity, unspecified: Secondary | ICD-10-CM | POA: Insufficient documentation

## 2015-09-23 DIAGNOSIS — S46012A Strain of muscle(s) and tendon(s) of the rotator cuff of left shoulder, initial encounter: Secondary | ICD-10-CM | POA: Insufficient documentation

## 2015-09-23 DIAGNOSIS — F17210 Nicotine dependence, cigarettes, uncomplicated: Secondary | ICD-10-CM | POA: Insufficient documentation

## 2015-09-23 DIAGNOSIS — Z7982 Long term (current) use of aspirin: Secondary | ICD-10-CM | POA: Insufficient documentation

## 2015-09-23 DIAGNOSIS — Y999 Unspecified external cause status: Secondary | ICD-10-CM | POA: Insufficient documentation

## 2015-09-23 MED ORDER — ETODOLAC 400 MG PO TABS: 400.0000 mg | ORAL_TABLET | Freq: Two times a day (BID) | ORAL | Status: DC

## 2015-09-23 NOTE — ED Notes (Signed)
Pt arrives to ER c/o left shoulder pain, chronic. Worsening over past 3 days. Pt alert and oriented X4, active, cooperative, pt in NAD. RR even and unlabored, color WNL.  No new injuries.

## 2015-09-23 NOTE — ED Provider Notes (Signed)
[facility] Emergency Department Provider Note ____________________________________________  Time seen: Approximately 12:01 PM  I have reviewed the triage vital signs and the nursing notes.   HISTORY  Chief Complaint Shoulder Pain   HPI Samantha Wells is a 50 y.o. female is here complaining of left shoulder and upper arm pain for approximately 3 weeks. Patient states is gotten worse last 3 days and is actually interrupting her sleep at night.Patient states she had rotator cuff repair done earlier in the year and has not had any problems until recently. 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 She is unaware of any injury that may have caused her shoulder*hurting. During this time she has not taken any over-the-counter medication for pain. Pain is increased when patient tries to abduct her arm. She denies any chest pain, shortness of breath or cardiac history. She rates her pain as 9/10.   History reviewed. No pertinent past medical history.  There are no active problems to display for this patient.   Past Surgical History  Procedure Laterality Date  . Left shoulder repair Left     Current Outpatient Rx  Name  Route  Sig  Dispense  Refill  . aspirin 81 MG tablet   Oral   Take 81 mg by mouth daily.         . Calcium Carb-Cholecalciferol (OS-CAL) 500-600 MG-UNIT CHEW   Oral   Chew 1 tablet by mouth daily.         Marland Kitchen. etodolac (LODINE) 400 MG tablet   Oral   Take 1 tablet (400 mg total) by mouth 2 (two) times daily.   20 tablet   0   . ferrous sulfate 325 (65 FE) MG tablet   Oral   Take 325 mg by mouth daily with breakfast.           Allergies Review of patient's allergies indicates no known allergies.  No family history on file.  Social History Social History  Substance Use Topics  . Smoking status: Current Every Day Smoker    Types: Cigarettes  . Smokeless tobacco: None  . Alcohol Use: No    Review of  Systems Constitutional: No fever/chills Cardiovascular: Denies chest pain. Respiratory: Denies shortness of breath. Musculoskeletal: Positive for left shoulder and upper arm pain. Skin: Negative for rash. Neurological: Negative for headaches, focal weakness or numbness.  10-point ROS otherwise negative.  ____________________________________________   PHYSICAL EXAM:  VITAL SIGNS: ED Triage Vitals  Enc Vitals Group     BP 09/23/15 1057 110/62 mmHg     Pulse Rate 09/23/15 1057 76     Resp --      Temp 09/23/15 1057 98.2 F (36.8 C)     Temp Source 09/23/15 1057 Oral     SpO2 09/23/15 1057 98 %     Weight --      Height --      Head Cir --      Peak Flow --      Pain Score 09/23/15 1055 9     Pain Loc --      Pain Edu? --      Excl. in GC? --     Constitutional: Alert and oriented. Well appearing and in no acute distress. Eyes: Conjunctivae are normal. PERRL. EOMI. Head: Atraumatic. Nose: No congestion/rhinnorhea. Neck: No stridor.   Cardiovascular: Normal rate, regular rhythm. Grossly normal heart sounds.  Good peripheral circulation. Respiratory: Normal respiratory effort.  No retractions. Lungs CTAB. Musculoskeletal:Left shoulder  no gross deformities noted. There is difficulty with range of motion the patient is able to move in all planes with the exception of abduction which is restricted to approximately 45. There is no swelling or erythema in the area. There is moderate tenderness on palpation at the rotator cuff. No crepitus is noted in the left shoulder. Neurologic:  Normal speech and language. No gross focal neurologic deficits are appreciated. No gait instability. Skin:  Skin is warm, dry and intact. No erythema, ecchymosis or abrasions were noted. Psychiatric: Mood and affect are normal. Speech and behavior are normal.  ____________________________________________   LABS (all labs ordered are listed, but only abnormal results are displayed)  Labs Reviewed -  No data to display  PROCEDURES  Procedure(s) performed: None  Critical Care performed: No  ____________________________________________   INITIAL IMPRESSION / ASSESSMENT AND PLAN / ED COURSE  Pertinent labs & imaging results that were available during my care of the patient were reviewed by me and considered in my medical decision making (see chart for details).  Patient was placed on etodolac 400 mg twice a day with food. Patient is to follow-up with her orthopedist in [place] which is who did her rotator cuff surgery. Patient is all work this weekend. She'll use a sling for support only for the next 2 days. ____________________________________________   FINAL CLINICAL IMPRESSION(S) / ED DIAGNOSES  Final diagnoses:  Rotator cuff (capsule) sprain and strain, left, initial encounter      Tommi Rumps, PA-C 09/23/15 1206  Sharyn Creamer, MD 09/23/15 1451

## 2015-09-23 NOTE — Discharge Instructions (Signed)
Cryotherapy Cryotherapy is when you put ice on your injury. Ice helps lessen pain and puffiness (swelling) after an injury. Ice works the best when you start using it in the first 24 to 48 hours after an injury. HOME CARE  Put a dry or damp towel between the ice pack and your skin.  You may press gently on the ice pack.  Leave the ice on for no more than 10 to 20 minutes at a time.  Check your skin after 5 minutes to make sure your skin is okay.  Rest at least 20 minutes between ice pack uses.  Stop using ice when your skin loses feeling (numbness).  Do not use ice on someone who cannot tell you when it hurts. This includes small children and people with memory problems (dementia). GET HELP RIGHT AWAY IF:  You have white spots on your skin.  Your skin turns blue or pale.  Your skin feels waxy or hard.  Your puffiness gets worse. MAKE SURE YOU:   Understand these instructions.  Will watch your condition.  Will get help right away if you are not doing well or get worse.   This information is not intended to replace advice given to you by your health care provider. Make sure you discuss any questions you have with your health care provider.   Document Released: 11/06/2007 Document Revised: 08/12/2011 Document Reviewed: 01/10/2011 Elsevier Interactive Patient Education 2016 [employer]    Take etodolac 400 mg twice a day with food. Ice to your arm as needed for pain. Wear sling for 2 days for support only. Continue to use your arm to prevent a frozen shoulder.

## 2016-01-08 ENCOUNTER — Observation Stay
Admission: EM | Admit: 2016-01-08 | Discharge: 2016-01-09 | Disposition: A | Discharge status: Home / Self Care | Payer: PRIVATE HEALTH INSURANCE | Attending: Internal Medicine | Admitting: Internal Medicine

## 2016-01-08 ENCOUNTER — Emergency Department: Payer: PRIVATE HEALTH INSURANCE

## 2016-01-08 DIAGNOSIS — R61 Generalized hyperhidrosis: Secondary | ICD-10-CM

## 2016-01-08 DIAGNOSIS — Z8249 Family history of ischemic heart disease and other diseases of the circulatory system: Secondary | ICD-10-CM | POA: Insufficient documentation

## 2016-01-08 DIAGNOSIS — R079 Chest pain, unspecified: Secondary | ICD-10-CM

## 2016-01-08 DIAGNOSIS — I10 Essential (primary) hypertension: Secondary | ICD-10-CM

## 2016-01-08 DIAGNOSIS — Z7982 Long term (current) use of aspirin: Secondary | ICD-10-CM | POA: Insufficient documentation

## 2016-01-08 DIAGNOSIS — Z79899 Other long term (current) drug therapy: Secondary | ICD-10-CM | POA: Insufficient documentation

## 2016-01-08 DIAGNOSIS — E876 Hypokalemia: Secondary | ICD-10-CM | POA: Insufficient documentation

## 2016-01-08 DIAGNOSIS — R0789 Other chest pain: Principal | ICD-10-CM | POA: Insufficient documentation

## 2016-01-08 DIAGNOSIS — F17210 Nicotine dependence, cigarettes, uncomplicated: Secondary | ICD-10-CM | POA: Insufficient documentation

## 2016-01-08 DIAGNOSIS — R739 Hyperglycemia, unspecified: Secondary | ICD-10-CM

## 2016-01-08 DIAGNOSIS — R1013 Epigastric pain: Secondary | ICD-10-CM | POA: Insufficient documentation

## 2016-01-08 HISTORY — DX: Essential (primary) hypertension: I10

## 2016-01-08 LAB — CBC
HCT: 36.3 % (ref 35.0–47.0)
Hemoglobin: 12.6 g/dL (ref 12.0–16.0)
MCH: 31.3 pg (ref 26.0–34.0)
MCHC: 34.6 g/dL (ref 32.0–36.0)
MCV: 90.6 fL (ref 80.0–100.0)
PLATELETS: 208 10*3/uL (ref 150–440)
RBC: 4.01 MIL/uL (ref 3.80–5.20)
RDW: 13.7 % (ref 11.5–14.5)
WBC: 7.1 10*3/uL (ref 3.6–11.0)

## 2016-01-08 LAB — BASIC METABOLIC PANEL
Anion gap: 7 (ref 5–15)
BUN: 18 mg/dL (ref 6–20)
CHLORIDE: 107 mmol/L (ref 101–111)
CO2: 25 mmol/L (ref 22–32)
CREATININE: 0.98 mg/dL (ref 0.44–1.00)
Calcium: 9.3 mg/dL (ref 8.9–10.3)
GFR calc Af Amer: >60 mL/min (ref >60)
GFR calc non Af Amer: >60 mL/min (ref >60)
GLUCOSE: 100 mg/dL — AB (ref 65–99)
Potassium: 3.4 mmol/L — ABNORMAL LOW (ref 3.5–5.1)
Sodium: 139 mmol/L (ref 135–145)

## 2016-01-08 LAB — TROPONIN I: Troponin I: <0.03 ng/mL (ref <0.03)

## 2016-01-08 MED ORDER — ONDANSETRON HCL 4 MG/2ML IJ SOLN
4.0000 mg | Freq: Once | INTRAMUSCULAR | Status: AC
  Administered 2016-01-09: 4 mg via INTRAVENOUS
  Filled 2016-01-08: qty 2

## 2016-01-08 MED ORDER — KETOROLAC TROMETHAMINE 30 MG/ML IJ SOLN
30.0000 mg | Freq: Once | INTRAMUSCULAR | Status: AC
  Administered 2016-01-09: 30 mg via INTRAVENOUS
  Filled 2016-01-08: qty 1

## 2016-01-08 MED ORDER — MORPHINE SULFATE (PF) 4 MG/ML IV SOLN
4.0000 mg | Freq: Once | INTRAVENOUS | Status: AC
  Administered 2016-01-09: 4 mg via INTRAVENOUS
  Filled 2016-01-08: qty 1

## 2016-01-08 MED ORDER — SODIUM CHLORIDE 0.9 % IV BOLUS (SEPSIS)
500.0000 mL | Freq: Once | INTRAVENOUS | Status: AC
  Administered 2016-01-09: 500 mL via INTRAVENOUS

## 2016-01-08 NOTE — ED Triage Notes (Signed)
Pt from home via EMS, reports central chest pain since 4 today, hurts more with deep breathing. Pt given aspirin by fire dept. Hx of HTN, out of medications

## 2016-01-08 NOTE — ED Provider Notes (Signed)
[facility] Emergency Department Provider Note   ____________________________________________   First MD Initiated Contact with Patient 01/08/16 2322     (approximate)  I have reviewed the triage vital signs and the nursing notes.   HISTORY  Chief Complaint Chest Pain    HPI Samantha Wells is a 50 y.o. female who presents to the ED from home via EMS with a chief complaint of chest pain. Patient reports central sharp chest pain since 4 PM. Symptoms are not associated with diaphoresis, shortness of breath, nausea, vomiting or palpitations. Pain is exacerbated with movement and deep breathing. Patient states she has been coughing this week. Denies associated fevers, chills, abdominal pain, nausea, vomiting, diarrhea. Denies recent travel, trauma or hormone use. Patient was given aspirin by fire department prior to arrival. Reports she has a history of hypertension only and she is currently out of her medications.   Past Medical History:  Diagnosis Date  . Hypertension     There are no active problems to display for this patient.   Past Surgical History:  Procedure Laterality Date  . Left Shoulder Repair Left     Prior to Admission medications   Medication Sig Start Date End Date Taking? Authorizing Provider  aspirin 81 MG tablet Take 81 mg by mouth daily.    Historical Provider, MD  Calcium Carb-Cholecalciferol (OS-CAL) 500-600 MG-UNIT CHEW Chew 1 tablet by mouth daily.    Historical Provider, MD  etodolac (LODINE) 400 MG tablet Take 1 tablet (400 mg total) by mouth 2 (two) times daily. 09/23/15   Tommi Rumpshonda L Summers, PA-C  ferrous sulfate 325 (65 FE) MG tablet Take 325 mg by mouth daily with breakfast.    Historical Provider, MD    Allergies Review of patient's allergies indicates no known allergies.  Family history Diabetes Heart disease  Social History Social History  Substance Use Topics  . Smoking status: Current Every Day Smoker   Types: Cigarettes  . Smokeless tobacco: Not on file  . Alcohol use No    Review of Systems  Constitutional: No fever/chills. Eyes: No visual changes. ENT: No sore throat. Cardiovascular: Positive for chest pain. Respiratory: Denies shortness of breath. Gastrointestinal: No abdominal pain.  No nausea, no vomiting.  No diarrhea.  No constipation. Genitourinary: Negative for dysuria. Musculoskeletal: Negative for back pain. Skin: Negative for rash. Neurological: Negative for headaches, focal weakness or numbness.  10-point ROS otherwise negative.  ____________________________________________   PHYSICAL EXAM:  VITAL SIGNS: ED Triage Vitals  Enc Vitals Group     BP 01/08/16 2150 (!) 141/93     Pulse Rate 01/08/16 2150 84     Resp 01/08/16 2153 19     Temp 01/08/16 2149 98.5 F (36.9 C)     Temp src --      SpO2 01/08/16 2150 98 %     Weight --      Height --      Head Circumference --      Peak Flow --      Pain Score 01/08/16 2153 10     Pain Loc --      Pain Edu? --      Excl. in GC? --     Constitutional: Alert and oriented. Well appearing and in mild acute distress. Eyes: Conjunctivae are normal. PERRL. EOMI. Head: Atraumatic. Nose: No congestion/rhinnorhea. Mouth/Throat: Mucous membranes are moist.  Oropharynx non-erythematous. Neck: No stridor.   Cardiovascular: Normal rate, regular rhythm. Grossly normal heart sounds.  Good peripheral  circulation. Respiratory: Normal respiratory effort.  No retractions. Lungs CTAB. Hacking dry cough. Central chest wall tender to palpation and with movement of trunk. Gastrointestinal: Soft and nontender. No distention. No abdominal bruits. No CVA tenderness. Musculoskeletal: No lower extremity tenderness nor edema.  No joint effusions. Neurologic:  Normal speech and language. No gross focal neurologic deficits are appreciated.  Skin:  Skin is warm, dry and intact. No rash noted. Psychiatric: Mood and affect are normal. Speech  and behavior are normal.  ____________________________________________   LABS (all labs ordered are listed, but only abnormal results are displayed)  Labs Reviewed  BASIC METABOLIC PANEL - Abnormal; Notable for the following:       Result Value   Potassium 3.4 (*)    Glucose, Bld 100 (*)    All other components within normal limits  CBC  TROPONIN I  TROPONIN I  FIBRIN DERIVATIVES D-DIMER ([facility] ONLY)  BRAIN NATRIURETIC PEPTIDE   ____________________________________________  EKG  ED ECG REPORT I, SUNG,JADE J, the attending physician, personally viewed and interpreted this ECG.   Date: 01/08/2016  EKG Time: 2152  Rate: 86  Rhythm: normal EKG, normal sinus rhythm  Axis: Normal  Intervals:none  ST&T Change: Nonspecific  ____________________________________________  RADIOLOGY  Chest 2 view (viewed by me, interpreted per Dr. Cherly Hensen): No acute cardiopulmonary process seen ____________________________________________   PROCEDURES  Procedure(s) performed: None  Procedures  Critical Care performed:   CRITICAL CARE Performed by: Irean Hong   Total critical care time: 30 minutes  Critical care time was exclusive of separately billable procedures and treating other patients.  Critical care was necessary to treat or prevent imminent or life-threatening deterioration.  Critical care was time spent personally by me on the following activities: development of treatment plan with patient and/or surrogate as well as nursing, discussions with consultants, evaluation of patient's response to treatment, examination of patient, obtaining history from patient or surrogate, ordering and performing treatments and interventions, ordering and review of laboratory studies, ordering and review of radiographic studies, pulse oximetry and re-evaluation of patient's condition.  ____________________________________________   INITIAL IMPRESSION / ASSESSMENT AND PLAN / ED  COURSE  Pertinent labs & imaging results that were available during my care of the patient were reviewed by me and considered in my medical decision making (see chart for details).  50 year old female with a history of hypertension who presents with reproducible chest discomfort. Review of patient's records reveal that she had a negative stress test in July 2016. Initial troponin and EKG are unremarkable. Will add d-dimer for patient's complaint of pleuritic chest pain, administer IV analgesia, repeat timed troponin and reassess.  Clinical Course  Comment By Time  Reexamined patient who is sleeping with eyes closed, tachycardic to 117, tears streaming down her face and diaphoretic. Recheck oral temperature is 98.51F. Unclear etiology of patient's chest pain, but with persistent chest pain and elevated blood pressure, will discuss with hospitalist to evaluate patient in the emergency department for hospitalization for chest pain. Irean Hong, MD 08/08 0143     ____________________________________________   FINAL CLINICAL IMPRESSION(S) / ED DIAGNOSES  Final diagnoses:  Nonspecific chest pain  Essential hypertension  Diaphoresis      NEW MEDICATIONS STARTED DURING THIS VISIT:  New Prescriptions   No medications on file     Note:  This document was prepared using Dragon voice recognition software and may include unintentional dictation errors.    Irean Hong, MD 01/09/16 [phone]

## 2016-01-08 NOTE — ED Notes (Signed)
Patient transported to X-ray 

## 2016-01-09 ENCOUNTER — Encounter: Payer: Self-pay | Admitting: Internal Medicine

## 2016-01-09 ENCOUNTER — Other Ambulatory Visit: Payer: PRIVATE HEALTH INSURANCE

## 2016-01-09 DIAGNOSIS — R0789 Other chest pain: Secondary | ICD-10-CM | POA: Diagnosis present

## 2016-01-09 DIAGNOSIS — I10 Essential (primary) hypertension: Secondary | ICD-10-CM

## 2016-01-09 DIAGNOSIS — R739 Hyperglycemia, unspecified: Secondary | ICD-10-CM

## 2016-01-09 DIAGNOSIS — R1013 Epigastric pain: Secondary | ICD-10-CM

## 2016-01-09 DIAGNOSIS — E876 Hypokalemia: Secondary | ICD-10-CM

## 2016-01-09 LAB — FIBRIN DERIVATIVES D-DIMER (ARMC ONLY): FIBRIN DERIVATIVES D-DIMER (ARMC): 267 (ref 0–499)

## 2016-01-09 LAB — TROPONIN I

## 2016-01-09 LAB — LIPID PANEL
CHOLESTEROL: 113 mg/dL (ref 0–200)
HDL: 53 mg/dL (ref >40)
LDL Cholesterol: 54 mg/dL (ref 0–99)
TRIGLYCERIDES: 28 mg/dL (ref <150)
Total CHOL/HDL Ratio: 2.1 RATIO
VLDL: 6 mg/dL (ref 0–40)

## 2016-01-09 LAB — TSH: TSH: 2.531 u[IU]/mL (ref 0.350–4.500)

## 2016-01-09 LAB — BRAIN NATRIURETIC PEPTIDE: B NATRIURETIC PEPTIDE 5: 18 pg/mL (ref 0.0–100.0)

## 2016-01-09 MED ORDER — CALCIUM CARBONATE-VITAMIN D 500-200 MG-UNIT PO TABS
1.0000 | ORAL_TABLET | Freq: Every day | ORAL | Status: DC
  Administered 2016-01-09: 1 via ORAL
  Filled 2016-01-09: qty 1

## 2016-01-09 MED ORDER — NITROGLYCERIN 2 % TD OINT
1.0000 [in_us] | TOPICAL_OINTMENT | Freq: Once | TRANSDERMAL | Status: AC
  Administered 2016-01-09: 1 [in_us] via TOPICAL
  Filled 2016-01-09: qty 1

## 2016-01-09 MED ORDER — SODIUM CHLORIDE 0.9 % IV BOLUS (SEPSIS): 1000.0000 mL | Freq: Once | INTRAVENOUS | Status: DC

## 2016-01-09 MED ORDER — METOPROLOL TARTRATE 25 MG PO TABS
25.0000 mg | ORAL_TABLET | Freq: Two times a day (BID) | ORAL | Status: DC
  Administered 2016-01-09: 25 mg via ORAL
  Filled 2016-01-09: qty 1

## 2016-01-09 MED ORDER — MORPHINE SULFATE (PF) 2 MG/ML IV SOLN: 2.0000 mg | INTRAVENOUS | Status: DC | PRN

## 2016-01-09 MED ORDER — SODIUM CHLORIDE 0.9 % IV BOLUS (SEPSIS)
500.0000 mL | Freq: Once | INTRAVENOUS | Status: AC
  Administered 2016-01-09: 500 mL via INTRAVENOUS

## 2016-01-09 MED ORDER — PANTOPRAZOLE SODIUM 40 MG PO TBEC: 40.0000 mg | DELAYED_RELEASE_TABLET | Freq: Every day | ORAL | 3 refills | Status: DC

## 2016-01-09 MED ORDER — ACETAMINOPHEN 325 MG PO TABS: 650.0000 mg | ORAL_TABLET | ORAL | Status: DC | PRN

## 2016-01-09 MED ORDER — ONDANSETRON HCL 4 MG/2ML IJ SOLN: 4.0000 mg | Freq: Four times a day (QID) | INTRAMUSCULAR | Status: DC | PRN

## 2016-01-09 MED ORDER — FERROUS SULFATE 325 (65 FE) MG PO TABS
325.0000 mg | ORAL_TABLET | Freq: Every day | ORAL | Status: DC
  Administered 2016-01-09: 325 mg via ORAL
  Filled 2016-01-09: qty 1

## 2016-01-09 MED ORDER — GUAIFENESIN-CODEINE 100-10 MG/5ML PO SOLN
5.0000 mL | ORAL | 0 refills | Status: DC | PRN
Start: 2016-01-09 — End: 2017-05-22

## 2016-01-09 MED ORDER — CALCIUM CARB-CHOLECALCIFEROL 500-600 MG-UNIT PO CHEW: 1.0000 | CHEWABLE_TABLET | Freq: Every day | ORAL | Status: DC

## 2016-01-09 MED ORDER — ETODOLAC 400 MG PO TABS
400.0000 mg | ORAL_TABLET | Freq: Two times a day (BID) | ORAL | Status: DC
  Administered 2016-01-09: 400 mg via ORAL
  Filled 2016-01-09: qty 1

## 2016-01-09 MED ORDER — POTASSIUM CHLORIDE CRYS ER 20 MEQ PO TBCR
20.0000 meq | EXTENDED_RELEASE_TABLET | Freq: Once | ORAL | Status: AC
Start: 2016-01-09 — End: 2016-01-09
  Administered 2016-01-09: 20 meq via ORAL
  Filled 2016-01-09: qty 1

## 2016-01-09 MED ORDER — ENOXAPARIN SODIUM 40 MG/0.4ML ~~LOC~~ SOLN: 40.0000 mg | SUBCUTANEOUS | Status: DC

## 2016-01-09 MED ORDER — GI COCKTAIL ~~LOC~~: 30.0000 mL | Freq: Four times a day (QID) | ORAL | Status: DC | PRN

## 2016-01-09 MED ORDER — ASPIRIN 81 MG PO CHEW
81.0000 mg | CHEWABLE_TABLET | Freq: Every day | ORAL | Status: DC
  Administered 2016-01-09: 81 mg via ORAL
  Filled 2016-01-09: qty 1

## 2016-01-09 NOTE — Progress Notes (Signed)
Samantha Wells is a 50 y.o. female  [id number]  Primary Cardiologist: Adrian BlackwaterShaukat Bazil Dhanani Reason for Consultation: Chest pain  HPI: This is a 50 year old white female with a past medical history of hypertension presented to the hospital with chest pain associated with shortness of breath. Patient was sleeping is a resting and has no further chest pain.   Review of Systems: No orthopnea PND or leg swelling   Past Medical History:  Diagnosis Date  . Hypertension     Medications Prior to Admission  Medication Sig Dispense Refill  . aspirin 81 MG tablet Take 81 mg by mouth daily.    . Calcium Carb-Cholecalciferol (OS-CAL) 500-600 MG-UNIT CHEW Chew 1 tablet by mouth daily.    Marland Kitchen. etodolac (LODINE) 400 MG tablet Take 1 tablet (400 mg total) by mouth 2 (two) times daily. 20 tablet 0  . ferrous sulfate 325 (65 FE) MG tablet Take 325 mg by mouth daily with breakfast.       . aspirin  81 mg Oral Daily  . calcium-vitamin D  1 tablet Oral Daily  . enoxaparin (LOVENOX) injection  40 mg Subcutaneous Q24H  . etodolac  400 mg Oral BID  . ferrous sulfate  325 mg Oral Q breakfast  . metoprolol tartrate  25 mg Oral BID    Infusions:    No Known Allergies  Social History   Social History  . Marital status: Single    Spouse name: N/A  . Number of children: N/A  . Years of education: N/A   Occupational History  . works for assisted living facility    Social History Main Topics  . Smoking status: Current Every Day Smoker    Types: Cigarettes  . Smokeless tobacco: Never Used  . Alcohol use No  . Drug use: No  . Sexual activity: Not on file   Other Topics Concern  . Not on file   Social History Narrative  . No narrative on file    Family History  Problem Relation Age of Onset  . CAD Mother   . Hypertension Sister     PHYSICAL EXAM: Vitals:   01/09/16 0300 01/09/16 0417  BP: 126/75 135/65  Pulse: 85 65  Resp: 17   Temp:  98.5 F (36.9 C)     Intake/Output Summary  (Last 24 hours) at 01/09/16 0853 Last data filed at 01/09/16 0752  Gross per 24 hour  Intake                0 ml  Output              700 ml  Net             -700 ml    General:  Well appearing. No respiratory difficulty HEENT: normal Neck: supple. no JVD. Carotids 2+ bilat; no bruits. No lymphadenopathy or thryomegaly appreciated. Cor: PMI nondisplaced. Regular rate & rhythm. No rubs, gallops or murmurs. Lungs: clear Abdomen: soft, nontender, nondistended. No hepatosplenomegaly. No bruits or masses. Good bowel sounds. Extremities: no cyanosis, clubbing, rash, edema Neuro: alert & oriented x 3, cranial nerves grossly intact. moves all 4 extremities w/o difficulty. Affect pleasant.  ECG: Normal sinus rhythm nonspecific ST-T changes.  Results for orders placed or performed during the hospital encounter of 01/08/16 (from the past 24 hour(s))  Basic metabolic panel     Status: Abnormal   Collection Time: 01/08/16 10:16 PM  Result Value Ref Range   Sodium 139 135 - 145 mmol/L  Potassium 3.4 (L) 3.5 - 5.1 mmol/L   Chloride 107 101 - 111 mmol/L   CO2 25 22 - 32 mmol/L   Glucose, Bld 100 (H) 65 - 99 mg/dL   BUN 18 6 - 20 mg/dL   Creatinine, Ser 9.32 0.44 - 1.00 mg/dL   Calcium 9.3 8.9 - 35.5 mg/dL   GFR calc non Af Amer >60 >60 mL/min   GFR calc Af Amer >60 >60 mL/min   Anion gap 7 5 - 15  CBC     Status: None   Collection Time: 01/08/16 10:16 PM  Result Value Ref Range   WBC 7.1 3.6 - 11.0 K/uL   RBC 4.01 3.80 - 5.20 MIL/uL   Hemoglobin 12.6 12.0 - 16.0 g/dL   HCT 73.2 20.2 - 54.2 %   MCV 90.6 80.0 - 100.0 fL   MCH 31.3 26.0 - 34.0 pg   MCHC 34.6 32.0 - 36.0 g/dL   RDW 70.6 23.7 - 62.8 %   Platelets 208 150 - 440 K/uL  Troponin I     Status: None   Collection Time: 01/08/16 10:16 PM  Result Value Ref Range   Troponin I <0.03 <0.03 ng/mL  Brain natriuretic peptide     Status: None   Collection Time: 01/08/16 10:16 PM  Result Value Ref Range   B Natriuretic Peptide 18.0  0.0 - 100.0 pg/mL  Troponin I     Status: None   Collection Time: 01/09/16 12:03 AM  Result Value Ref Range   Troponin I <0.03 <0.03 ng/mL  Fibrin derivatives D-Dimer     Status: None   Collection Time: 01/09/16 12:03 AM  Result Value Ref Range   Fibrin derivatives D-dimer ([facility]) 267 0 - 499  Lipid panel     Status: None   Collection Time: 01/09/16  4:38 AM  Result Value Ref Range   Cholesterol 113 0 - 200 mg/dL   Triglycerides 28 <315 mg/dL   HDL 53 >17 mg/dL   Total CHOL/HDL Ratio 2.1 RATIO   VLDL 6 0 - 40 mg/dL   LDL Cholesterol 54 0 - 99 mg/dL  TSH     Status: None   Collection Time: 01/09/16  4:38 AM  Result Value Ref Range   TSH 2.531 0.350 - 4.500 uIU/mL   Dg Chest 2 View  Result Date: 01/08/2016 CLINICAL DATA:  Acute onset of central chest pain. Initial encounter. EXAM: CHEST  2 VIEW COMPARISON:  Chest radiograph performed 07/19/2015 FINDINGS: The lungs are well-aerated and clear. There is no evidence of focal opacification, pleural effusion or pneumothorax. The heart is normal in size; the mediastinal contour is within normal limits. No acute osseous abnormalities are seen. IMPRESSION: No acute cardiopulmonary process seen. Electronically Signed   By: Roanna Raider M.D.   On: 01/08/2016 22:41     ASSESSMENT AND PLAN: Atypical chest pain patient has ruled out for myocardial infarction and EKG has no acute changes. Will do outpatient stress test tomorrow along with echocardiogram. Patient can be discharged on aspirin and tomorrow patient is scheduled for a stress test 8:30 in the morning.  Loranzo Desha A

## 2016-01-09 NOTE — Plan of Care (Signed)
Problem: Phase I Progression Outcomes Goal: MD aware of Cardiac Marker results Outcome: Completed/Met Date Met: 01/09/16 Troponin are currently neg

## 2016-01-09 NOTE — Plan of Care (Signed)
Problem: Phase I Progression Outcomes Goal: Aspirin unless contraindicated Outcome: Completed/Met Date Met: 01/09/16 ASA given in ED on arrival

## 2016-01-09 NOTE — Progress Notes (Signed)
Per Dr. Welton FlakesKhan, stress can be done outpatient and patient can be discharged today. Updated Dr. Winona LegatoVaickute. MD to round shortly. Dr. Welton FlakesKhan has already spoken to patient and given detailed instructions regarding outpatient test.

## 2016-01-09 NOTE — H&P (Signed)
[facility] Physicians - [place] at [facility]   PATIENT NAME: Samantha Wells    MR#:  [id number]  DATE OF BIRTH:  1966/03/28  DATE OF ADMISSION:  01/08/2016  PRIMARY CARE PHYSICIAN: No PCP Per Patient   REQUESTING/REFERRING PHYSICIAN:   CHIEF COMPLAINT:   Chief Complaint  Patient presents with  . Chest Pain    HISTORY OF PRESENT ILLNESS: Samantha Wells  is a 50 y.o. female with a known history of Hypertension presented to the emergency room with chest pain started last night. Pain is located in the left Side of the chest and radiates to left shoulder. Chest pain is sharp in nature 9 out of 10 on a scale of 1-10. No history of any shortness of breath. No history of any orthopnea or proximal nocturnal dyspnea. First set of troponin is negative. During the workup in the emergency room d-dimer was checked and was normal. Patient never had any cardiac workup in the past. No history of headache dizziness blurry vision. No history of syncope or seizure. Hospitalist service was consulted for further care of the patient.  PAST MEDICAL HISTORY:   Past Medical History:  Diagnosis Date  . Hypertension     PAST SURGICAL HISTORY: Past Surgical History:  Procedure Laterality Date  . Left Shoulder Repair Left     SOCIAL HISTORY:  Social History  Substance Use Topics  . Smoking status: Current Every Day Smoker    Types: Cigarettes  . Smokeless tobacco: Never Used  . Alcohol use No    FAMILY HISTORY:  Family History  Problem Relation Age of Onset  . CAD Mother   . Hypertension Sister     DRUG ALLERGIES: No Known Allergies  REVIEW OF SYSTEMS:   CONSTITUTIONAL: No fever, fatigue or weakness.  EYES: No blurred or double vision.  EARS, NOSE, AND THROAT: No tinnitus or ear pain.  RESPIRATORY: No cough, shortness of breath, wheezing or hemoptysis.  CARDIOVASCULAR: Has chest pain,no orthopnea, edema.  GASTROINTESTINAL: No nausea, vomiting, diarrhea or abdominal pain.   GENITOURINARY: No dysuria, hematuria.  ENDOCRINE: No polyuria, nocturia,  HEMATOLOGY: No anemia, easy bruising or bleeding SKIN: No rash or lesion. MUSCULOSKELETAL: No joint pain or arthritis.   NEUROLOGIC: No tingling, numbness, weakness.  PSYCHIATRY: No anxiety or depression.   MEDICATIONS AT HOME:  Prior to Admission medications   Medication Sig Start Date End Date Taking? Authorizing Provider  aspirin 81 MG tablet Take 81 mg by mouth daily.    Historical Provider, MD  Calcium Carb-Cholecalciferol (OS-CAL) 500-600 MG-UNIT CHEW Chew 1 tablet by mouth daily.    Historical Provider, MD  etodolac (LODINE) 400 MG tablet Take 1 tablet (400 mg total) by mouth 2 (two) times daily. 09/23/15   Tommi Rumps, PA-C  ferrous sulfate 325 (65 FE) MG tablet Take 325 mg by mouth daily with breakfast.    Historical Provider, MD      PHYSICAL EXAMINATION:   VITAL SIGNS: Blood pressure 126/75, pulse 85, temperature 98.5 F (36.9 C), resp. rate 17, last menstrual period 02/23/2015, SpO2 98 %.  GENERAL:  50 y.o.-year-old patient lying in the bed with no acute distress.  EYES: Pupils equal, round, reactive to light and accommodation. No scleral icterus. Extraocular muscles intact.  HEENT: Head atraumatic, normocephalic. Oropharynx and nasopharynx clear.  NECK:  Supple, no jugular venous distention. No thyroid enlargement, no tenderness.  LUNGS: Normal breath sounds bilaterally, no wheezing, rales,rhonchi or crepitation. No use of accessory muscles of respiration.  CARDIOVASCULAR: S1,  S2 normal. No murmurs, rubs, or gallops.  ABDOMEN: Soft, nontender, nondistended. Bowel sounds present. No organomegaly or mass.  EXTREMITIES: 1 plus pedal edema, cyanosis, or clubbing.  NEUROLOGIC: Cranial nerves II through XII are intact. Muscle strength 5/5 in all extremities. Sensation intact. Gait not checked.  PSYCHIATRIC: The patient is alert and oriented x 3.  SKIN: No obvious rash, lesion, or ulcer.    LABORATORY PANEL:   CBC  Recent Labs Lab 01/08/16 2216  WBC 7.1  HGB 12.6  HCT 36.3  PLT 208  MCV 90.6  MCH 31.3  MCHC 34.6  RDW 13.7   ------------------------------------------------------------------------------------------------------------------  Chemistries   Recent Labs Lab 01/08/16 2216  NA 139  K 3.4*  CL 107  CO2 25  GLUCOSE 100*  BUN 18  CREATININE 0.98  CALCIUM 9.3   ------------------------------------------------------------------------------------------------------------------ CrCl cannot be calculated (Unknown ideal weight.). ------------------------------------------------------------------------------------------------------------------ No results for input(s): TSH, T4TOTAL, T3FREE, THYROIDAB in the last 72 hours.  Invalid input(s): FREET3   Coagulation profile No results for input(s): INR, PROTIME in the last 168 hours. ------------------------------------------------------------------------------------------------------------------- No results for input(s): DDIMER in the last 72 hours. -------------------------------------------------------------------------------------------------------------------  Cardiac Enzymes  Recent Labs Lab 01/08/16 2216 01/09/16 0003  TROPONINI <0.03 <0.03   ------------------------------------------------------------------------------------------------------------------ Invalid input(s): POCBNP  ---------------------------------------------------------------------------------------------------------------  Urinalysis    Component Value Date/Time   COLORURINE RED (A) 02/27/2015 1250   APPEARANCEUR TURBID (A) 02/27/2015 1250   LABSPEC 1.016 02/27/2015 1250   PHURINE 7.5 02/27/2015 1250   GLUCOSEU NEGATIVE 02/27/2015 1250   HGBUR LARGE (A) 02/27/2015 1250   BILIRUBINUR NEGATIVE 02/27/2015 1250   KETONESUR NEGATIVE 02/27/2015 1250   PROTEINUR 100 (A) 02/27/2015 1250   UROBILINOGEN 1.0 02/27/2015  1250   NITRITE NEGATIVE 02/27/2015 1250   LEUKOCYTESUR SMALL (A) 02/27/2015 1250     RADIOLOGY: Dg Chest 2 View  Result Date: 01/08/2016 CLINICAL DATA:  Acute onset of central chest pain. Initial encounter. EXAM: CHEST  2 VIEW COMPARISON:  Chest radiograph performed 07/19/2015 FINDINGS: The lungs are well-aerated and clear. There is no evidence of focal opacification, pleural effusion or pneumothorax. The heart is normal in size; the mediastinal contour is within normal limits. No acute osseous abnormalities are seen. IMPRESSION: No acute cardiopulmonary process seen. Electronically Signed   By: Roanna RaiderJeffery  Chang M.D.   On: 01/08/2016 22:41    EKG: Orders placed or performed during the hospital encounter of 01/08/16  . ED EKG within 10 minutes  . ED EKG within 10 minutes  . EKG 12-Lead  . EKG 12-Lead    IMPRESSION AND PLAN: 50 year old female patient with history of hypertension presented to the emergency room with chest pain. Admitting diagnosis 1. Chest pain 2. Hypertension 3.hypokalemia   treatment plan Admit patient to telemetry observation bed Aspirin 81 mg daily Cycle troponin to rule out ischemia Check echocardiogram Cardiology consultation Replace potassium When necessary nitrates for chest pain Supportive care.  All the records are reviewed and case discussed with ED provider. Management plans discussed with the patient, family and they are in agreement.  CODE STATUS:FULL Code Status History    This patient does not have a recorded code status. Please follow your organizational policy for patients in this situation.       TOTAL TIME TAKING CARE OF THIS PATIENT: 52 minutes.    Ihor AustinPavan Jaycie Kregel M.D on 01/09/2016 at 3:10 AM  Between 7am to 6pm - Pager - [phone]  After 6pm go to [email or web address] - Scientist, research (life [email or web address])password [email or web address]  [place] [facility]  Office  [phone]  CC: Primary care physician; No PCP Per Patient

## 2016-01-09 NOTE — Plan of Care (Signed)
Problem: Phase I Progression Outcomes Goal: Voiding-avoid urinary catheter unless indicated Outcome: Completed/Met Date Met: 01/09/16 Patient is voiding fine

## 2016-01-09 NOTE — Progress Notes (Signed)
          To Whom It May Concern:       Ms. Samantha Wells was hospitalized at [facility] on 01/09/2016 . Please excuse her from work. Thank you for your understanding.      Sincerely,  Katharina Caperima Loretto Belinsky, MD

## 2016-01-09 NOTE — Plan of Care (Signed)
Problem: Consults Goal: Chest Pain Patient Education (See Patient Education module for education specifics.) Outcome: Completed/Met Date Met: 01/09/16 Patient is currently free of chest pain. Will continue to monitor.

## 2016-01-09 NOTE — Plan of Care (Signed)
Problem: Consults Goal: Skin Care Protocol Initiated - if Braden Score 18 or less If consults are not indicated, leave blank or document N/A Outcome: Completed/Met Date Met: 01/09/16 No skin issues noted will continue to monitor.Marland Kitchen

## 2016-01-09 NOTE — Discharge Summary (Signed)
[facility] Physicians - [place] at [facility]   PATIENT NAME: Samantha Wells    MR#:  [id number]  DATE OF BIRTH:  1966/03/02  DATE OF ADMISSION:  01/08/2016 ADMITTING PHYSICIAN: Ihor Austin, MD  DATE OF DISCHARGE: 01/09/2016 11:28 AM  PRIMARY CARE PHYSICIAN: No PCP Per Patient     ADMISSION DIAGNOSIS:  Diaphoresis [R61] Essential hypertension [I10] Nonspecific chest pain [R07.9]  DISCHARGE DIAGNOSIS:  Principal Problem:   Musculoskeletal chest pain Active Problems:   Abdominal pain, epigastric   Hypokalemia   Essential hypertension, benign   Hyperglycemia   SECONDARY DIAGNOSIS:   Past Medical History:  Diagnosis Date  . Hypertension     .pro HOSPITAL COURSE:   The patient is a 50 year old female with past medical history significant for history of essential hypertension who presents to the hospital with complaints of chest pain, radiating to left shoulder. Patient's pain was described as sharp, 9 out of 10 by intensity, worsening with chest pressure and cough or taking deep breath. No significant shortness of breath. EKG showed LVH and prolonged QTC, no acute ST-T changes. Cardiac enzymes were unremarkable 2. Patient was seen by cardiologist and recommended outpatient stress test, echocardiogram and follow-up with him. Patient was ambulated and she had no pain and was felt to be stable to be discharged home. Discussion by problem: #1. Musculoskeletal chest pain, likely due to cough, rule out cardiac issue, patient is to follow up with cardiologist as outpatient for outpatient stress test, echocardiogram, patient is to continue aspirin therapy for now. She is going to be prescribed Robitussin to improve her cough and thus chest discomfort, which was felt to be musculoskeletal. D-dimer was normal at 267, patient was not hypoxic while in the hospital, O2 sats 100% on room air. #2. Epigastric abdominal pain, etiology is unclear, possible gastritis, patient denies  any alcohol abuse, patient was advised to stop nonsteroidal anti-inflammatory medications, she is initiated on Protonix daily, follow clinically #3. Hypokalemia, supplemented orally, follow  as outpatient #4. Essential hypertension, well-controlled, follow as outpatient, continue low-salt diet #5. Hyperglycemia, get a hemoglobin A1c. TSH was checked and was found to be normal at 2.5   DISCHARGE CONDITIONS:   Stable  CONSULTS OBTAINED:  Treatment Team:  Laurier Nancy, MD  DRUG ALLERGIES:  No Known Allergies  DISCHARGE MEDICATIONS:   Discharge Medication List as of 01/09/2016 10:53 AM    START taking these medications   Details  guaiFENesin-codeine 100-10 MG/5ML syrup Take 5 mLs by mouth every 4 (four) hours as needed for cough., Starting Tue 01/09/2016, Print    pantoprazole (PROTONIX) 40 MG tablet Take 1 tablet (40 mg total) by mouth daily. Switch for any other PPI at similar dose and frequency, Starting Tue 01/09/2016, Print      CONTINUE these medications which have NOT CHANGED   Details  aspirin 81 MG tablet Take 81 mg by mouth daily., Until Discontinued, Historical Med    Calcium Carb-Cholecalciferol (OS-CAL) 500-600 MG-UNIT CHEW Chew 1 tablet by mouth daily., Until Discontinued, Historical Med    ferrous sulfate 325 (65 FE) MG tablet Take 325 mg by mouth daily with breakfast., Until Discontinued, Historical Med      STOP taking these medications     etodolac (LODINE) 400 MG tablet          DISCHARGE INSTRUCTIONS:    Patient is to follow up with cardiologist, Dr. Welton Flakes, also establish primary care physician  and be seen by him/her in one week  If you  experience worsening of your admission symptoms, develop shortness of breath, life threatening emergency, suicidal or homicidal thoughts you must seek medical attention immediately by calling 911 or calling your MD immediately  if symptoms less severe.  You Must read complete instructions/literature along with all the  possible adverse reactions/side effects for all the Medicines you take and that have been prescribed to you. Take any new Medicines after you have completely understood and accept all the possible adverse reactions/side effects.   Please note  You were cared for by a hospitalist during your hospital stay. If you have any questions about your discharge medications or the care you received while you were in the hospital after you are discharged, you can call the unit and asked to speak with the hospitalist on call if the hospitalist that took care of you is not available. Once you are discharged, your primary care physician will handle any further medical issues. Please note that NO REFILLS for any discharge medications will be authorized once you are discharged, as it is imperative that you return to your primary care physician (or establish a relationship with a primary care physician if you do not have one) for your aftercare needs so that they can reassess your need for medications and monitor your lab values.    Today   CHIEF COMPLAINT:   Chief Complaint  Patient presents with  . Chest Pain    HISTORY OF PRESENT ILLNESS:  Samantha Wells  is a 50 y.o. female with a known history of essential hypertension who presents to the hospital with complaints of chest pain, radiating to left shoulder. Patient's pain was described as sharp, 9 out of 10 by intensity, worsening with chest pressure and cough or taking deep breath. No significant shortness of breath. EKG showed LVH and prolonged QTC, no acute ST-T changes. Cardiac enzymes were unremarkable 2. Patient was seen by cardiologist and recommended outpatient stress test, echocardiogram and follow-up with him. Patient was ambulated and she had no pain and was felt to be stable to be discharged home. Discussion by problem: #1. Musculoskeletal chest pain, likely due to cough, rule out cardiac issue, patient is to follow up with cardiologist as  outpatient for outpatient stress test, echocardiogram, patient is to continue aspirin therapy for now. She is going to be prescribed Robitussin to improve her cough and thus chest discomfort, which was felt to be musculoskeletal. D-dimer was normal at 267, patient was not hypoxic while in the hospital, O2 sats 100% on room air. #2. Epigastric abdominal pain, etiology is unclear, possible gastritis, patient denies any alcohol abuse, patient was advised to stop nonsteroidal anti-inflammatory medications, she is initiated on Protonix daily, follow clinically #3. Hypokalemia, supplemented orally, follow  as outpatient #4. Essential hypertension, well-controlled, follow as outpatient, continue low-salt diet #5. Hyperglycemia, get a hemoglobin A1c. TSH was checked and was found to be normal at 2.5   VITAL SIGNS:  Blood pressure 136/77, pulse 72, temperature 97.6 F (36.4 C), temperature source Oral, resp. rate 18, weight 74.3 kg (163 lb 12.8 oz), last menstrual period 02/23/2015, SpO2 100 %.  I/O:   Intake/Output Summary (Last 24 hours) at 01/09/16 1335 Last data filed at 01/09/16 0752  Gross per 24 hour  Intake                0 ml  Output              700 ml  Net             -  700 ml    PHYSICAL EXAMINATION:  GENERAL:  50 y.o.-year-old patient lying in the bed with no acute distress. Intermittent dry cough  EYES: Pupils equal, round, reactive to light and accommodation. No scleral icterus. Extraocular muscles intact.  HEENT: Head atraumatic, normocephalic. Oropharynx and nasopharynx clear.  NECK:  Supple, no jugular venous distention. No thyroid enlargement, no tenderness.  LUNGS: Normal breath sounds bilaterally, no wheezing, rales,rhonchi or crepitation. No use of accessory muscles of respiration.  CARDIOVASCULAR: S1, S2 normal. No murmurs, rubs, or gallops.  ABDOMEN: Soft,mild discomfort in epigastric area, no rebound or guarding, non-distended. Bowel sounds present. No organomegaly or mass.   EXTREMITIES: No pedal edema, cyanosis, or clubbing.  NEUROLOGIC: Cranial nerves II through XII are intact. Muscle strength 5/5 in all extremities. Sensation intact. Gait not checked.  PSYCHIATRIC: The patient is alert and oriented x 3.  SKIN: No obvious rash, lesion, or ulcer.   DATA REVIEW:   CBC  Recent Labs Lab 01/08/16 2216  WBC 7.1  HGB 12.6  HCT 36.3  PLT 208    Chemistries   Recent Labs Lab 01/08/16 2216  NA 139  K 3.4*  CL 107  CO2 25  GLUCOSE 100*  BUN 18  CREATININE 0.98  CALCIUM 9.3    Cardiac Enzymes  Recent Labs Lab 01/09/16 0003  TROPONINI <0.03    Microbiology Results  No results found for this or any previous visit.  RADIOLOGY:  Dg Chest 2 View  Result Date: 01/08/2016 CLINICAL DATA:  Acute onset of central chest pain. Initial encounter. EXAM: CHEST  2 VIEW COMPARISON:  Chest radiograph performed 07/19/2015 FINDINGS: The lungs are well-aerated and clear. There is no evidence of focal opacification, pleural effusion or pneumothorax. The heart is normal in size; the mediastinal contour is within normal limits. No acute osseous abnormalities are seen. IMPRESSION: No acute cardiopulmonary process seen. Electronically Signed   By: Roanna Raider M.D.   On: 01/08/2016 22:41    EKG:   Orders placed or performed during the hospital encounter of 01/08/16  . ED EKG within 10 minutes  . ED EKG within 10 minutes  . EKG 12-Lead  . EKG 12-Lead  . EKG 12-Lead (at 6am)      Management plans discussed with the patient, family and they are in agreement.  CODE STATUS:     Code Status Orders        Start     Ordered   01/09/16 0351  Full code  Continuous     01/09/16 0350    Code Status History    Date Active Date Inactive Code Status Order ID Comments User Context   This patient has a current code status but no historical code status.      TOTAL TIME TAKING CARE OF THIS PATIENT: 40  minutes.    Katharina Caper M.D on 01/09/2016 at 1:35  PM  Between 7am to 6pm - Pager - [phone]  After 6pm go to [email or web address] - password [email or web address] [facility]  [place] [facility]  Office  [phone]  CC: Primary care physician; No PCP Per Patient

## 2016-01-09 NOTE — Progress Notes (Signed)
Discharge instructions along with home medications and follow up gone over with patient and son. Both verbalize that they understood instructions. Two prescriptions given to patient. IV and tele removed. Pt being discharged home on room air, no distress noted.

## 2016-01-09 NOTE — Plan of Care (Signed)
Problem: Consults Goal: Tobacco Cessation referral if indicated Outcome: Not Progressing Patient is a current smoker.

## 2016-01-09 NOTE — Plan of Care (Signed)
Problem: Phase I Progression Outcomes Goal: Hemodynamically stable Outcome: Completed/Met Date Met: 01/09/16 Patient and VS stabled

## 2016-01-15 ENCOUNTER — Encounter: Payer: Self-pay | Admitting: *Deleted

## 2016-01-15 ENCOUNTER — Observation Stay
Admission: EM | Admit: 2016-01-15 | Discharge: 2016-01-16 | Disposition: A | Discharge status: Home / Self Care | Payer: Self-pay | Attending: Internal Medicine | Admitting: Internal Medicine

## 2016-01-15 ENCOUNTER — Emergency Department: Payer: Self-pay

## 2016-01-15 DIAGNOSIS — I340 Nonrheumatic mitral (valve) insufficiency: Secondary | ICD-10-CM | POA: Insufficient documentation

## 2016-01-15 DIAGNOSIS — Z9889 Other specified postprocedural states: Secondary | ICD-10-CM | POA: Insufficient documentation

## 2016-01-15 DIAGNOSIS — Z7982 Long term (current) use of aspirin: Secondary | ICD-10-CM | POA: Insufficient documentation

## 2016-01-15 DIAGNOSIS — Z79899 Other long term (current) drug therapy: Secondary | ICD-10-CM | POA: Insufficient documentation

## 2016-01-15 DIAGNOSIS — R079 Chest pain, unspecified: Secondary | ICD-10-CM | POA: Diagnosis present

## 2016-01-15 DIAGNOSIS — I10 Essential (primary) hypertension: Secondary | ICD-10-CM | POA: Insufficient documentation

## 2016-01-15 DIAGNOSIS — R0789 Other chest pain: Principal | ICD-10-CM | POA: Insufficient documentation

## 2016-01-15 DIAGNOSIS — F17210 Nicotine dependence, cigarettes, uncomplicated: Secondary | ICD-10-CM | POA: Insufficient documentation

## 2016-01-15 DIAGNOSIS — Z8249 Family history of ischemic heart disease and other diseases of the circulatory system: Secondary | ICD-10-CM | POA: Insufficient documentation

## 2016-01-15 LAB — CBC
HCT: 40 % (ref 35.0–47.0)
HEMOGLOBIN: 13.2 g/dL (ref 12.0–16.0)
MCH: 30.6 pg (ref 26.0–34.0)
MCHC: 33.1 g/dL (ref 32.0–36.0)
MCV: 92.4 fL (ref 80.0–100.0)
PLATELETS: 214 10*3/uL (ref 150–440)
RBC: 4.33 MIL/uL (ref 3.80–5.20)
RDW: 13.5 % (ref 11.5–14.5)
WBC: 8.1 10*3/uL (ref 3.6–11.0)

## 2016-01-15 LAB — BASIC METABOLIC PANEL
Anion gap: 8 (ref 5–15)
BUN: 20 mg/dL (ref 6–20)
CHLORIDE: 105 mmol/L (ref 101–111)
CO2: 25 mmol/L (ref 22–32)
CREATININE: 0.82 mg/dL (ref 0.44–1.00)
Calcium: 9.7 mg/dL (ref 8.9–10.3)
GFR calc Af Amer: >60 mL/min (ref >60)
GFR calc non Af Amer: >60 mL/min (ref >60)
Glucose, Bld: 109 mg/dL — ABNORMAL HIGH (ref 65–99)
POTASSIUM: 3.7 mmol/L (ref 3.5–5.1)
Sodium: 138 mmol/L (ref 135–145)

## 2016-01-15 LAB — TROPONIN I
TROPONIN I: 0.04 ng/mL — AB (ref <0.03)
Troponin I: <0.03 ng/mL (ref <0.03)

## 2016-01-15 MED ORDER — TRAMADOL HCL 50 MG PO TABS: 50.0000 mg | ORAL_TABLET | Freq: Four times a day (QID) | ORAL | 0 refills | Status: DC | PRN

## 2016-01-15 NOTE — ED Triage Notes (Signed)
Pt was admitted Monday night for chest pain, had out patient echo and stress test, pt developed chest pain today, pt complains of central chest pain with dyspnea, pt in no distress during triage, o2 st on RA 100%

## 2016-01-15 NOTE — ED Notes (Addendum)
Pt states she has centralized CP that began around 4p today.  States she was not doing anything out of ordinary when pain began. States headache and states troubled breathing. Denies sweatiness or nausea.  Dr. Lenard LancePaduchowski at bedside.   Pt states she has been coughing a lot recently. States pain with palpation to sternum area.

## 2016-01-15 NOTE — ED Notes (Signed)
Charge nurse notified of troponin results; awaiting first open bed for further evaluation

## 2016-01-15 NOTE — Discharge Instructions (Signed)
You have been seen in the emergency department today for chest pain. Your workup has shown normal results. As we discussed please follow-up with your primary care physician in the next 1-2 days for recheck. Return to the emergency department for any further chest pain, trouble breathing, or any other symptom personally concerning to yourself. °

## 2016-01-15 NOTE — ED Notes (Signed)
Pt ambulatory to toilet

## 2016-01-15 NOTE — ED Provider Notes (Addendum)
[facility] Emergency Department Provider Note  Time seen: 8:14 PM  I have reviewed the triage vital signs and the nursing notes.   HISTORY  Chief Complaint Chest Pain    HPI Samantha Wells is a 50 y.o. female With a past medical history of musculoskeletal chest pain, epigastric pain, hyperglycemia, hypertension, who presents to the emergency department with chest pain. According to the patient around 4 PM tonight she developed chest discomfort which she describes as pain in the center of her chest, moderate in severity, worse if she pushes on the area. The patient does state for the past one week she has had intermittent mild cough as well, denies sputum production and fever. Denies shortness of breath diaphoresis or nausea.   Past Medical History:  Diagnosis Date  . Hypertension     Patient Active Problem List   Diagnosis Date Noted  . Musculoskeletal chest pain 01/09/2016  . Abdominal pain, epigastric 01/09/2016  . Essential hypertension, benign 01/09/2016  . Hypokalemia 01/09/2016  . Hyperglycemia 01/09/2016    Past Surgical History:  Procedure Laterality Date  . Left Shoulder Repair Left     Prior to Admission medications   Medication Sig Start Date End Date Taking? Authorizing Provider  aspirin 81 MG tablet Take 81 mg by mouth daily.    Historical Provider, MD  Calcium Carb-Cholecalciferol (OS-CAL) 500-600 MG-UNIT CHEW Chew 1 tablet by mouth daily.    Historical Provider, MD  ferrous sulfate 325 (65 FE) MG tablet Take 325 mg by mouth daily with breakfast.    Historical Provider, MD  guaiFENesin-codeine 100-10 MG/5ML syrup Take 5 mLs by mouth every 4 (four) hours as needed for cough. 01/09/16   Katharina Caperima Vaickute, MD  pantoprazole (PROTONIX) 40 MG tablet Take 1 tablet (40 mg total) by mouth daily. Switch for any other PPI at similar dose and frequency 01/09/16   Katharina Caperima Vaickute, MD    No Known Allergies  Family History  Problem Relation Age of Onset  .  CAD Mother   . Hypertension Sister     Social History Social History  Substance Use Topics  . Smoking status: Current Every Day Smoker    Types: Cigarettes  . Smokeless tobacco: Never Used  . Alcohol use No    Review of Systems Constitutional: Negative for fever Cardiovascular: Positive for chest pain since 4 PM, somewhat improved but remains mild to moderate. Respiratory: Negative for shortness of breath. Gastrointestinal: Negative for abdominal pain Musculoskeletal: Negative for back pain Neurological: Negative for headaches, focal weakness or numbness. 10-point ROS otherwise negative.  ____________________________________________   PHYSICAL EXAM:  VITAL SIGNS: ED Triage Vitals  Enc Vitals Group     BP 01/15/16 1733 (!) 165/84     Pulse Rate 01/15/16 1733 76     Resp 01/15/16 1733 20     Temp 01/15/16 1733 98 F (36.7 C)     Temp Source 01/15/16 1733 Oral     SpO2 01/15/16 1733 100 %     Weight 01/15/16 1733 163 lb (73.9 kg)     Height 01/15/16 1733 5\' 4"  (1.626 m)     Head Circumference --      Peak Flow --      Pain Score 01/15/16 1734 10     Pain Loc --      Pain Edu? --      Excl. in GC? --     Constitutional: Alert and oriented. Well appearing and in no distress. Eyes: Normal exam ENT  Head: Normocephalic and atraumatic   Mouth/Throat: Mucous membranes are moist. Cardiovascular: Normal rate, regular rhythm. No murmur. Moderate central chest tenderness to palpation. Respiratory: Normal respiratory effort without tachypnea nor retractions. Breath sounds are clear Gastrointestinal: Soft and nontender. No distention.  Musculoskeletal: Nontender with normal range of motion in all extremities.  Neurologic:  Normal speech and language. No gross focal neurologic deficits  Skin:  Skin is warm, dry and intact.  Psychiatric: Mood and affect are normal. Speech and behavior are normal.   ____________________________________________    EKG  EKG  reviewed and interpreted by myself shows normal sinus rhythm at 81 bpm, narrow QRS, normal axis, normal intervals, no concerning ST changes. Overall normal/reassuring EKG.  ____________________________________________    RADIOLOGY  Chest x-ray negative  ____________________________________________   INITIAL IMPRESSION / ASSESSMENT AND PLAN / ED COURSE  Pertinent labs & imaging results that were available during my care of the patient were reviewed by me and considered in my medical decision making (see chart for details).  Patient presents with chest pain since 4 PM today. Pain is very reproducible on exam. Labs show a slight elevation of troponin at 0.04. Chest x-ray is normal. EKG is reassuring. Given the slight elevation in troponin we will repeat a troponin at 3 hours. As long as the repeat troponin is unchanged we'll discharge home with pain medication for likely muscular skeletal pain.  Repeat troponin is negative. Highly suspect musculoskeletal/chest wall pain. We'll discharge with a short course of pain medication PCP follow-up.  ----------------------------------------- 11:12 PM on 01/15/2016 -----------------------------------------  ST chest pain is highly reproducible, second troponin is negative at discharge patient from the emergency department. However once the family arrived to pick up the patient they inform me that the patient had recently seen Dr.Khan and had an abnormal stress test next currently attempting to arrange a cardiac catheterization. Call Dr. Welton FlakesKhan who states he would like the patient admitted to the hospital for further workup and cardiac catheterization. We will attempt to on discharge patient from dementia department and admitted to the hospital for further treatment.  ____________________________________________   FINAL CLINICAL IMPRESSION(S) / ED DIAGNOSES  Chest pain    Minna AntisKevin Lennyn Gange, MD 01/15/16 2157    Minna AntisKevin Sequoia Mincey, MD 01/15/16  [phone]

## 2016-01-15 NOTE — ED Notes (Signed)
Pt lying on stretcher, on cardiac monitor. Pt watching tv, lights dimmed.

## 2016-01-16 ENCOUNTER — Encounter
Admission: EM | Disposition: A | Discharge status: Home / Self Care | Payer: Self-pay | Source: Home / Self Care | Attending: Internal Medicine

## 2016-01-16 ENCOUNTER — Inpatient Hospital Stay
Admit: 2016-01-16 | Discharge: 2016-01-16 | Disposition: A | Discharge status: Home / Self Care | Payer: Self-pay | Attending: Cardiovascular Disease | Admitting: Cardiovascular Disease

## 2016-01-16 DIAGNOSIS — R079 Chest pain, unspecified: Secondary | ICD-10-CM | POA: Insufficient documentation

## 2016-01-16 HISTORY — PX: CARDIAC CATHETERIZATION: SHX172

## 2016-01-16 LAB — CBC
HEMATOCRIT: 36.4 % (ref 35.0–47.0)
Hemoglobin: 12.5 g/dL (ref 12.0–16.0)
MCH: 31.1 pg (ref 26.0–34.0)
MCHC: 34.2 g/dL (ref 32.0–36.0)
MCV: 90.9 fL (ref 80.0–100.0)
PLATELETS: 205 10*3/uL (ref 150–440)
RBC: 4.01 MIL/uL (ref 3.80–5.20)
RDW: 13.8 % (ref 11.5–14.5)
WBC: 7.1 10*3/uL (ref 3.6–11.0)

## 2016-01-16 LAB — MAGNESIUM: MAGNESIUM: 1.9 mg/dL (ref 1.7–2.4)

## 2016-01-16 LAB — BASIC METABOLIC PANEL
Anion gap: 8 (ref 5–15)
BUN: 16 mg/dL (ref 6–20)
CHLORIDE: 105 mmol/L (ref 101–111)
CO2: 24 mmol/L (ref 22–32)
Calcium: 9.2 mg/dL (ref 8.9–10.3)
Creatinine, Ser: 0.76 mg/dL (ref 0.44–1.00)
Glucose, Bld: 94 mg/dL (ref 65–99)
POTASSIUM: 3.3 mmol/L — AB (ref 3.5–5.1)
SODIUM: 137 mmol/L (ref 135–145)

## 2016-01-16 LAB — PROTIME-INR
INR: 0.92
Prothrombin Time: 12.4 seconds (ref 11.4–15.2)

## 2016-01-16 LAB — LIPID PANEL
CHOL/HDL RATIO: 2.2 ratio
Cholesterol: 132 mg/dL (ref 0–200)
HDL: 60 mg/dL (ref >40)
LDL CALC: 65 mg/dL (ref 0–99)
TRIGLYCERIDES: 33 mg/dL (ref <150)
VLDL: 7 mg/dL (ref 0–40)

## 2016-01-16 LAB — PHOSPHORUS: Phosphorus: 3.5 mg/dL (ref 2.5–4.6)

## 2016-01-16 LAB — APTT: APTT: 31 s (ref 24–36)

## 2016-01-16 LAB — TSH: TSH: 2.615 u[IU]/mL (ref 0.350–4.500)

## 2016-01-16 LAB — TROPONIN I: Troponin I: <0.03 ng/mL (ref <0.03)

## 2016-01-16 SURGERY — ECHOCARDIOGRAM, TRANSESOPHAGEAL
Anesthesia: Moderate Sedation

## 2016-01-16 SURGERY — RIGHT/LEFT HEART CATH AND CORONARY ANGIOGRAPHY
Anesthesia: Moderate Sedation | Laterality: Right

## 2016-01-16 MED ORDER — ASPIRIN 81 MG PO CHEW: 81.0000 mg | CHEWABLE_TABLET | ORAL | Status: DC

## 2016-01-16 MED ORDER — LIDOCAINE VISCOUS 2 % MT SOLN
OROMUCOSAL | Status: AC
  Filled 2016-01-16: qty 15

## 2016-01-16 MED ORDER — METOPROLOL SUCCINATE ER 25 MG PO TB24: 12.5000 mg | ORAL_TABLET | Freq: Every day | ORAL | 0 refills | Status: DC

## 2016-01-16 MED ORDER — FENTANYL CITRATE (PF) 100 MCG/2ML IJ SOLN
INTRAMUSCULAR | Status: AC
  Filled 2016-01-16: qty 2

## 2016-01-16 MED ORDER — NITROGLYCERIN 0.4 MG SL SUBL
SUBLINGUAL_TABLET | SUBLINGUAL | Status: AC
  Administered 2016-01-16: 0.4 mg via SUBLINGUAL
  Filled 2016-01-16: qty 1

## 2016-01-16 MED ORDER — BUTAMBEN-TETRACAINE-BENZOCAINE 2-2-14 % EX AERO
INHALATION_SPRAY | CUTANEOUS | Status: AC
  Filled 2016-01-16: qty 20

## 2016-01-16 MED ORDER — METOPROLOL SUCCINATE ER 25 MG PO TB24
12.5000 mg | ORAL_TABLET | Freq: Every day | ORAL | Status: DC
  Administered 2016-01-16: 12.5 mg via ORAL
  Filled 2016-01-16: qty 1

## 2016-01-16 MED ORDER — TRAMADOL HCL 50 MG PO TABS: 50.0000 mg | ORAL_TABLET | Freq: Four times a day (QID) | ORAL | 0 refills | Status: AC | PRN

## 2016-01-16 MED ORDER — ONDANSETRON HCL 4 MG/2ML IJ SOLN
INTRAMUSCULAR | Status: DC | PRN
  Administered 2016-01-16: 4 mg via INTRAVENOUS

## 2016-01-16 MED ORDER — ATROPINE SULFATE 1 MG/10ML IJ SOSY
PREFILLED_SYRINGE | INTRAMUSCULAR | Status: DC | PRN
  Administered 2016-01-16: 0.5 mg via INTRAVENOUS

## 2016-01-16 MED ORDER — ENOXAPARIN SODIUM 40 MG/0.4ML ~~LOC~~ SOLN: 40.0000 mg | Freq: Every day | SUBCUTANEOUS | Status: DC

## 2016-01-16 MED ORDER — SODIUM CHLORIDE 0.9 % WEIGHT BASED INFUSION: 1.0000 mL/kg/h | INTRAVENOUS | Status: DC

## 2016-01-16 MED ORDER — SODIUM CHLORIDE 0.9 % WEIGHT BASED INFUSION: 1.0000 mL/kg/h | INTRAVENOUS | Status: AC

## 2016-01-16 MED ORDER — ONDANSETRON HCL 4 MG/2ML IJ SOLN: 4.0000 mg | Freq: Four times a day (QID) | INTRAMUSCULAR | Status: DC | PRN

## 2016-01-16 MED ORDER — HEPARIN (PORCINE) IN NACL 2-0.9 UNIT/ML-% IJ SOLN
INTRAMUSCULAR | Status: AC
  Filled 2016-01-16: qty 500

## 2016-01-16 MED ORDER — ASPIRIN EC 81 MG PO TBEC
81.0000 mg | DELAYED_RELEASE_TABLET | Freq: Every day | ORAL | Status: DC
  Administered 2016-01-16: 81 mg via ORAL
  Filled 2016-01-16: qty 1

## 2016-01-16 MED ORDER — ATORVASTATIN CALCIUM 20 MG PO TABS: 80.0000 mg | ORAL_TABLET | Freq: Once | ORAL | Status: DC

## 2016-01-16 MED ORDER — FENTANYL CITRATE (PF) 100 MCG/2ML IJ SOLN
INTRAMUSCULAR | Status: AC
  Filled 2016-01-16: qty 4

## 2016-01-16 MED ORDER — SODIUM CHLORIDE 0.9 % IV SOLN: 250.0000 mL | INTRAVENOUS | Status: DC | PRN

## 2016-01-16 MED ORDER — SODIUM CHLORIDE 0.9% FLUSH: 3.0000 mL | INTRAVENOUS | Status: DC | PRN

## 2016-01-16 MED ORDER — MIDAZOLAM HCL 5 MG/5ML IJ SOLN
INTRAMUSCULAR | Status: AC
  Filled 2016-01-16: qty 10

## 2016-01-16 MED ORDER — ACETAMINOPHEN 325 MG PO TABS
650.0000 mg | ORAL_TABLET | Freq: Four times a day (QID) | ORAL | Status: DC | PRN
  Administered 2016-01-16: 650 mg via ORAL
  Filled 2016-01-16: qty 2

## 2016-01-16 MED ORDER — FENTANYL CITRATE (PF) 100 MCG/2ML IJ SOLN
INTRAMUSCULAR | Status: DC | PRN
  Administered 2016-01-16: 50 ug via INTRAVENOUS

## 2016-01-16 MED ORDER — SODIUM CHLORIDE 0.9% FLUSH: 3.0000 mL | Freq: Two times a day (BID) | INTRAVENOUS | Status: DC

## 2016-01-16 MED ORDER — SODIUM CHLORIDE 0.9% FLUSH
3.0000 mL | Freq: Two times a day (BID) | INTRAVENOUS | Status: DC
  Administered 2016-01-16: 3 mL via INTRAVENOUS

## 2016-01-16 MED ORDER — IOPAMIDOL (ISOVUE-300) INJECTION 61%
INTRAVENOUS | Status: DC | PRN
  Administered 2016-01-16: 100 mL via INTRA_ARTERIAL

## 2016-01-16 MED ORDER — POTASSIUM CHLORIDE CRYS ER 20 MEQ PO TBCR
20.0000 meq | EXTENDED_RELEASE_TABLET | Freq: Two times a day (BID) | ORAL | Status: DC
Start: 2016-01-16 — End: 2016-01-16

## 2016-01-16 MED ORDER — PANTOPRAZOLE SODIUM 40 MG PO TBEC
40.0000 mg | DELAYED_RELEASE_TABLET | Freq: Every day | ORAL | Status: DC
  Administered 2016-01-16: 40 mg via ORAL
  Filled 2016-01-16: qty 1

## 2016-01-16 MED ORDER — MORPHINE SULFATE (PF) 2 MG/ML IV SOLN: 1.0000 mg | INTRAVENOUS | Status: DC | PRN

## 2016-01-16 MED ORDER — MIDAZOLAM HCL 2 MG/2ML IJ SOLN
INTRAMUSCULAR | Status: DC | PRN
  Administered 2016-01-16: 1 mg via INTRAVENOUS
  Administered 2016-01-16: 0.5 mg via INTRAVENOUS

## 2016-01-16 MED ORDER — MIDAZOLAM HCL 2 MG/2ML IJ SOLN
INTRAMUSCULAR | Status: DC | PRN
  Administered 2016-01-16 (×3): 1 mg via INTRAVENOUS

## 2016-01-16 MED ORDER — MIDAZOLAM HCL 2 MG/2ML IJ SOLN
INTRAMUSCULAR | Status: AC
  Filled 2016-01-16: qty 2

## 2016-01-16 MED ORDER — CLOPIDOGREL BISULFATE 75 MG PO TABS
300.0000 mg | ORAL_TABLET | Freq: Once | ORAL | Status: DC
Start: 2016-01-16 — End: 2016-01-16

## 2016-01-16 MED ORDER — SODIUM CHLORIDE 0.9 % IV SOLN: INTRAVENOUS | Status: DC

## 2016-01-16 MED ORDER — ACETAMINOPHEN 650 MG RE SUPP: 650.0000 mg | Freq: Four times a day (QID) | RECTAL | Status: DC | PRN

## 2016-01-16 MED ORDER — ATROPINE SULFATE 1 MG/10ML IJ SOSY
PREFILLED_SYRINGE | INTRAMUSCULAR | Status: AC
  Filled 2016-01-16: qty 10

## 2016-01-16 MED ORDER — HYDROCODONE-ACETAMINOPHEN 5-325 MG PO TABS: 1.0000 | ORAL_TABLET | ORAL | Status: DC | PRN

## 2016-01-16 MED ORDER — ONDANSETRON HCL 4 MG PO TABS: 4.0000 mg | ORAL_TABLET | Freq: Four times a day (QID) | ORAL | Status: DC | PRN

## 2016-01-16 MED ORDER — FENTANYL CITRATE (PF) 100 MCG/2ML IJ SOLN
INTRAMUSCULAR | Status: DC | PRN
  Administered 2016-01-16 (×2): 25 ug via INTRAVENOUS

## 2016-01-16 MED ORDER — NITROGLYCERIN 0.4 MG SL SUBL
0.4000 mg | SUBLINGUAL_TABLET | SUBLINGUAL | Status: DC | PRN
  Administered 2016-01-16 (×3): 0.4 mg via SUBLINGUAL

## 2016-01-16 MED ORDER — SODIUM CHLORIDE 0.9 % WEIGHT BASED INFUSION: 3.0000 mL/kg/h | INTRAVENOUS | Status: DC

## 2016-01-16 MED ORDER — SODIUM CHLORIDE FLUSH 0.9 % IV SOLN
INTRAVENOUS | Status: AC
  Filled 2016-01-16: qty 20

## 2016-01-16 MED ORDER — SODIUM CHLORIDE 0.9 % IV SOLN
INTRAVENOUS | Status: DC
  Administered 2016-01-16: 02:00:00 via INTRAVENOUS

## 2016-01-16 SURGICAL SUPPLY — 15 items
CATH INFINITI 5 FR 3DRC (CATHETERS) ×3 IMPLANT
CATH INFINITI 5FR ANG PIGTAIL (CATHETERS) ×3 IMPLANT
CATH INFINITI 5FR JL4 (CATHETERS) ×3 IMPLANT
CATH INFINITI JR4 5F (CATHETERS) ×3 IMPLANT
CATH SWANZ 7F THERMO (CATHETERS) ×3 IMPLANT
DEVICE CLOSURE MYNXGRIP 5F (Vascular Products) ×3 IMPLANT
DEVICE CLOSURE MYNXGRIP 6/7F (Vascular Products) ×3 IMPLANT
GAUZE SPONGE 4X4 12PLY STRL (GAUZE/BANDAGES/DRESSINGS) ×3 IMPLANT
KIT MANI 3VAL PERCEP (MISCELLANEOUS) ×3 IMPLANT
KIT RIGHT HEART (MISCELLANEOUS) ×3 IMPLANT
NEEDLE PERC 18GX7CM (NEEDLE) ×6 IMPLANT
PACK CARDIAC CATH (CUSTOM PROCEDURE TRAY) ×3 IMPLANT
SHEATH PINNACLE 5F 10CM (SHEATH) ×3 IMPLANT
SHEATH PINNACLE 7F 10CM (SHEATH) ×6 IMPLANT
WIRE EMERALD 3MM-J .035X150CM (WIRE) ×3 IMPLANT

## 2016-01-16 NOTE — ED Notes (Signed)
Pt's cousin stated that pt had stress test done last Wednesday and echo done today (01/15/16). Cousin also states stress test was abnomal and she had an abnormal EKG with the rescue squad. States pt was here last Monday.   Pt and cousin state cardiac hx in almost every family member in their family.

## 2016-01-16 NOTE — ED Notes (Signed)
Dr. Hugelmyer at bedside.  

## 2016-01-16 NOTE — Progress Notes (Signed)
*  PRELIMINARY RESULTS* Echocardiogram Echocardiogram Transesophageal has been performed.  Cristela BlueHege, Samantha Wells 01/16/2016, 10:35 AM

## 2016-01-16 NOTE — Discharge Summary (Signed)
[facility] - [place] [facility]   PATIENT NAME: Samantha Wells    MR#:  [id number]  DATE OF BIRTH:  08/30/1965  DATE OF ADMISSION:  01/15/2016 ADMITTING PHYSICIAN: Wyatt Hasteavid K Hower, MD  DATE OF DISCHARGE: 01/16/2016  PRIMARY CARE PHYSICIAN: No PCP Per Patient    ADMISSION DIAGNOSIS:  Chest pain, unspecified chest pain type [R07.9]  DISCHARGE DIAGNOSIS:  Active Problems:   Chest pain, rule out acute myocardial infarction   Chest pain   Cardiac catheterization and TEE done- negative.  SECONDARY DIAGNOSIS:   Past Medical History:  Diagnosis Date  . Hypertension     HOSPITAL COURSE:   * Came with chest pain, had positive stress test, so admitted - cardiologist did catheterization and TEE- no abnormalities. Suggested musculoskeletal pain. Discharge home. Continued all other home meds.  DISCHARGE CONDITIONS:   Stable.  CONSULTS OBTAINED:  Treatment Team:  Laurier NancyShaukat A Khan, MD  DRUG ALLERGIES:  No Known Allergies  DISCHARGE MEDICATIONS:   Current Discharge Medication List    START taking these medications   Details  metoprolol succinate (TOPROL-XL) 25 MG 24 hr tablet Take 0.5 tablets (12.5 mg total) by mouth daily. Qty: 30 tablet, Refills: 0    traMADol (ULTRAM) 50 MG tablet Take 1 tablet (50 mg total) by mouth every 6 (six) hours as needed. Qty: 20 tablet, Refills: 0      CONTINUE these medications which have NOT CHANGED   Details  aspirin 81 MG tablet Take 81 mg by mouth daily.    pantoprazole (PROTONIX) 40 MG tablet Take 1 tablet (40 mg total) by mouth daily. Switch for any other PPI at similar dose and frequency Qty: 30 tablet, Refills: 3    guaiFENesin-codeine 100-10 MG/5ML syrup Take 5 mLs by mouth every 4 (four) hours as needed for cough. Qty: 120 mL, Refills: 0         DISCHARGE INSTRUCTIONS:    Follow with PMD in 1 week.  If you experience worsening of your admission symptoms, develop shortness of breath, life  threatening emergency, suicidal or homicidal thoughts you must seek medical attention immediately by calling 911 or calling your MD immediately  if symptoms less severe.  You Must read complete instructions/literature along with all the possible adverse reactions/side effects for all the Medicines you take and that have been prescribed to you. Take any new Medicines after you have completely understood and accept all the possible adverse reactions/side effects.   Please note  You were cared for by a hospitalist during your hospital stay. If you have any questions about your discharge medications or the care you received while you were in the hospital after you are discharged, you can call the unit and asked to speak with the hospitalist on call if the hospitalist that took care of you is not available. Once you are discharged, your primary care physician will handle any further medical issues. Please note that NO REFILLS for any discharge medications will be authorized once you are discharged, as it is imperative that you return to your primary care physician (or establish a relationship with a primary care physician if you do not have one) for your aftercare needs so that they can reassess your need for medications and monitor your lab values.    Today   CHIEF COMPLAINT:   Chief Complaint  Patient presents with  . Chest Pain    HISTORY OF PRESENT ILLNESS:  Samantha Wells  is a 50 y.o. female with a  known history of Hypertension, hyperglycemia, hypokalemia and musculoskeletal chest pain was in a usual state of health until around 4 PM tonight when she developed chest discomfort which she describes as central, substernal, nonradiating and made worse if she pushes on the area.she denies any associated shortness of breath, palpitations, N/V, dizziness, light-headedness. 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 The patient does state for the past one week she has had intermittent mild cough as well, denies sputum production and fever.   Of note the patient was medically cleared for discharge from the emergency department after 2 troponins of 0.04 and <0.03 and a nonischemic EKG. However her family members informed the emergency department physician that Dr. Welton FlakesKhan requested that she be admitted to the hospital for catheterization given her recent abnormal outpatient stress test.   Otherwise there has been no change in status. Patient has been taking medication as prescribed and there has been no recent change in medication, diet, or exercise routine.  There has been no recent illness, travel or sick contacts.    Patient denies fevers/chills, weakness, dizziness, shortness of breath, N/V/C/D, abdominal pain, dysuria/frequency, changes in mental status.     VITAL SIGNS:  Blood pressure 138/85, pulse 80, temperature 97.8 F (36.6 C), temperature source Oral, resp. rate 12, height 5\' 4"  (1.626 m), weight 72.4 kg (159 lb 11.2 oz), last menstrual period 02/23/2015, SpO2 94 %.  I/O:   Intake/Output Summary (Last 24 hours) at 01/16/16 1502 Last data filed at 01/16/16 0700  Gross per 24 hour  Intake           251.25 ml  Output              800 ml  Net          -548.75 ml    PHYSICAL EXAMINATION:  GENERAL:  50 y.o.-year-old patient lying in the bed with no acute distress.  EYES: Pupils equal, round, reactive to light and accommodation. No scleral icterus. Extraocular muscles intact.  HEENT: Head atraumatic, normocephalic. Oropharynx and nasopharynx clear.  NECK:  Supple, no jugular venous distention. No thyroid enlargement, no tenderness.  LUNGS: Normal breath sounds bilaterally, no wheezing, rales,rhonchi or crepitation. No use of accessory muscles of respiration.  CARDIOVASCULAR: S1, S2 normal. No murmurs, rubs, or gallops.  ABDOMEN: Soft, non-tender, non-distended.  Bowel sounds present. No organomegaly or mass.  EXTREMITIES: No pedal edema, cyanosis, or clubbing.  NEUROLOGIC: Cranial nerves II through XII are intact. Muscle strength 5/5 in all extremities. Sensation intact. Gait not checked.  PSYCHIATRIC: The patient is alert and oriented x 3.  SKIN: No obvious rash, lesion, or ulcer.   DATA REVIEW:   CBC  Recent Labs Lab 01/16/16 0354  WBC 7.1  HGB 12.5  HCT 36.4  PLT 205    Chemistries   Recent Labs Lab 01/16/16 0354  NA 137  K 3.3*  CL 105  CO2 24  GLUCOSE 94  BUN 16  CREATININE 0.76  CALCIUM 9.2  MG 1.9    Cardiac Enzymes  Recent Labs Lab 01/16/16 0354  TROPONINI <0.03    Microbiology Results  No results found for this or any previous visit.  RADIOLOGY:  Dg Chest 2 View  Result Date: 01/15/2016 CLINICAL DATA:  Acute onset of central chest pain and dyspnea. Initial encounter. EXAM: CHEST  2 VIEW COMPARISON:  Chest radiograph performed 01/08/2016 FINDINGS:  The lungs are well-aerated and clear. There is no evidence of focal opacification, pleural effusion or pneumothorax. The heart is normal in size; the mediastinal contour is within normal limits. No acute osseous abnormalities are seen. IMPRESSION: No acute cardiopulmonary process seen. Electronically Signed   By: Roanna Raider M.D.   On: 01/15/2016 19:04    EKG:   Orders placed or performed during the hospital encounter of 01/15/16  . EKG 12-Lead  . EKG 12-Lead  . ED EKG within 10 minutes  . ED EKG within 10 minutes  . EKG - 12 lead  . EKG - 12 lead      Management plans discussed with the patient, family and they are in agreement.  CODE STATUS:     Code Status Orders        Start     Ordered   01/16/16 1441  Full code  Continuous     01/16/16 1440    Code Status History    Date Active Date Inactive Code Status Order ID Comments User Context   01/16/2016  1:31 AM 01/16/2016  9:57 AM Full Code [id number]  Tonye Royalty, DO Inpatient    01/09/2016  3:50 AM 01/09/2016  2:33 PM Full Code [id number]  Tonye Royalty, DO Inpatient      TOTAL TIME TAKING CARE OF THIS PATIENT: 35 minutes.    Altamese Dilling M.D on 01/16/2016 at 3:02 PM  Between 7am to 6pm - Pager - [phone]  After 6pm go to [email or web address] - Social research officer, government  [facility]  Office  [phone]  CC: Primary care physician; No PCP Per Patient   Note: This dictation was prepared with Dragon dictation along with smaller phrase technology. Any transcriptional errors that result from this process are unintentional.

## 2016-01-16 NOTE — Progress Notes (Signed)
Samantha Wells is a 50 y.o. female  [id number]  Primary Cardiologist: Adrian BlackwaterShaukat Aasir Daigler Reason for Consultation: Chest pain  HPI: This is a 50 year old African-American female with a past medical history of having chest pain and was seen in the hospital after ruling out myocardial infarction underwent stress tests in my office last Friday. Stress S showed moderate to large anterior wall reversible defect with normal left reticular systolic function and thus patient had ischemia in the LAD territory. Also underwent echocardiogram yesterday which showed possible ASD and possible vegetation in the right atrium close proximity to ASD. Patient now got admitted with pressure type chest pain associated with shortness of breath last night.   Review of Systems: No orthopnea PND or fever   Past Medical History:  Diagnosis Date  . Hypertension     Medications Prior to Admission  Medication Sig Dispense Refill  . aspirin 81 MG tablet Take 81 mg by mouth daily.    . pantoprazole (PROTONIX) 40 MG tablet Take 1 tablet (40 mg total) by mouth daily. Switch for any other PPI at similar dose and frequency 30 tablet 3  . guaiFENesin-codeine 100-10 MG/5ML syrup Take 5 mLs by mouth every 4 (four) hours as needed for cough. (Patient not taking: Reported on 01/15/2016) 120 mL 0     . [START ON 01/17/2016] aspirin  81 mg Oral Pre-Cath  . aspirin EC  81 mg Oral Daily  . atorvastatin  80 mg Oral Once  . clopidogrel  300 mg Oral Once  . enoxaparin (LOVENOX) injection  40 mg Subcutaneous QHS  . metoprolol succinate  12.5 mg Oral Daily  . pantoprazole  40 mg Oral QAC breakfast  . potassium chloride  20 mEq Oral BID  . sodium chloride flush  3 mL Intravenous Q12H  . sodium chloride flush  3 mL Intravenous Q12H    Infusions: . sodium chloride 75 mL/hr at 01/16/16 0135  . sodium chloride    . [START ON 01/17/2016] sodium chloride     Followed by  . [START ON 01/17/2016] sodium chloride      No Known  Allergies  Social History   Social History  . Marital status: Single    Spouse name: N/A  . Number of children: N/A  . Years of education: N/A   Occupational History  . works for assisted living facility    Social History Main Topics  . Smoking status: Current Every Day Smoker    Types: Cigarettes  . Smokeless tobacco: Never Used  . Alcohol use No  . Drug use: No  . Sexual activity: Not on file   Other Topics Concern  . Not on file   Social History Narrative  . No narrative on file    Family History  Problem Relation Age of Onset  . CAD Mother   . Hypertension Sister     PHYSICAL EXAM: Vitals:   01/16/16 0745 01/16/16 0752  BP: 126/74 137/82  Pulse: 64 71  Resp:    Temp:       Intake/Output Summary (Last 24 hours) at 01/16/16 0856 Last data filed at 01/16/16 0658  Gross per 24 hour  Intake           251.25 ml  Output              800 ml  Net          -548.75 ml    General:  Well appearing. No respiratory difficulty HEENT: normal Neck: supple.  no JVD. Carotids 2+ bilat; no bruits. No lymphadenopathy or thryomegaly appreciated. Cor: PMI nondisplaced. Regular rate & rhythm. No rubs, gallops or murmurs. Lungs: clear Abdomen: soft, nontender, nondistended. No hepatosplenomegaly. No bruits or masses. Good bowel sounds. Extremities: no cyanosis, clubbing, rash, edema Neuro: alert & oriented x 3, cranial nerves grossly intact. moves all 4 extremities w/o difficulty. Affect pleasant.  ZOX:WRUEA rhythm no acute ST changes  Results for orders placed or performed during the hospital encounter of 01/15/16 (from the past 24 hour(s))  Basic metabolic panel     Status: Abnormal   Collection Time: 01/15/16  5:36 PM  Result Value Ref Range   Sodium 138 135 - 145 mmol/L   Potassium 3.7 3.5 - 5.1 mmol/L   Chloride 105 101 - 111 mmol/L   CO2 25 22 - 32 mmol/L   Glucose, Bld 109 (H) 65 - 99 mg/dL   BUN 20 6 - 20 mg/dL   Creatinine, Ser 5.40 0.44 - 1.00 mg/dL    Calcium 9.7 8.9 - 98.1 mg/dL   GFR calc non Af Amer >60 >60 mL/min   GFR calc Af Amer >60 >60 mL/min   Anion gap 8 5 - 15  CBC     Status: None   Collection Time: 01/15/16  5:36 PM  Result Value Ref Range   WBC 8.1 3.6 - 11.0 K/uL   RBC 4.33 3.80 - 5.20 MIL/uL   Hemoglobin 13.2 12.0 - 16.0 g/dL   HCT 19.1 47.8 - 29.5 %   MCV 92.4 80.0 - 100.0 fL   MCH 30.6 26.0 - 34.0 pg   MCHC 33.1 32.0 - 36.0 g/dL   RDW 62.1 30.8 - 65.7 %   Platelets 214 150 - 440 K/uL  Troponin I     Status: Abnormal   Collection Time: 01/15/16  5:36 PM  Result Value Ref Range   Troponin I 0.04 (HH) <0.03 ng/mL  Troponin I     Status: None   Collection Time: 01/15/16  9:00 PM  Result Value Ref Range   Troponin I <0.03 <0.03 ng/mL  Basic metabolic panel     Status: Abnormal   Collection Time: 01/16/16  3:54 AM  Result Value Ref Range   Sodium 137 135 - 145 mmol/L   Potassium 3.3 (L) 3.5 - 5.1 mmol/L   Chloride 105 101 - 111 mmol/L   CO2 24 22 - 32 mmol/L   Glucose, Bld 94 65 - 99 mg/dL   BUN 16 6 - 20 mg/dL   Creatinine, Ser 8.46 0.44 - 1.00 mg/dL   Calcium 9.2 8.9 - 96.2 mg/dL   GFR calc non Af Amer >60 >60 mL/min   GFR calc Af Amer >60 >60 mL/min   Anion gap 8 5 - 15  CBC     Status: None   Collection Time: 01/16/16  3:54 AM  Result Value Ref Range   WBC 7.1 3.6 - 11.0 K/uL   RBC 4.01 3.80 - 5.20 MIL/uL   Hemoglobin 12.5 12.0 - 16.0 g/dL   HCT 95.2 84.1 - 32.4 %   MCV 90.9 80.0 - 100.0 fL   MCH 31.1 26.0 - 34.0 pg   MCHC 34.2 32.0 - 36.0 g/dL   RDW 40.1 02.7 - 25.3 %   Platelets 205 150 - 440 K/uL  Lipid panel     Status: None   Collection Time: 01/16/16  3:54 AM  Result Value Ref Range   Cholesterol 132 0 - 200 mg/dL  Triglycerides 33 <150 mg/dL   HDL 60 >16>40 mg/dL   Total CHOL/HDL Ratio 2.2 RATIO   VLDL 7 0 - 40 mg/dL   LDL Cholesterol 65 0 - 99 mg/dL  Magnesium     Status: None   Collection Time: 01/16/16  3:54 AM  Result Value Ref Range   Magnesium 1.9 1.7 - 2.4 mg/dL  Phosphorus      Status: None   Collection Time: 01/16/16  3:54 AM  Result Value Ref Range   Phosphorus 3.5 2.5 - 4.6 mg/dL  Protime-INR     Status: None   Collection Time: 01/16/16  3:54 AM  Result Value Ref Range   Prothrombin Time 12.4 11.4 - 15.2 seconds   INR 0.92   APTT     Status: None   Collection Time: 01/16/16  3:54 AM  Result Value Ref Range   aPTT 31 24 - 36 seconds  Troponin I     Status: None   Collection Time: 01/16/16  3:54 AM  Result Value Ref Range   Troponin I <0.03 <0.03 ng/mL  TSH     Status: None   Collection Time: 01/16/16  3:54 AM  Result Value Ref Range   TSH 2.615 0.350 - 4.500 uIU/mL   Dg Chest 2 View  Result Date: 01/15/2016 CLINICAL DATA:  Acute onset of central chest pain and dyspnea. Initial encounter. EXAM: CHEST  2 VIEW COMPARISON:  Chest radiograph performed 01/08/2016 FINDINGS: The lungs are well-aerated and clear. There is no evidence of focal opacification, pleural effusion or pneumothorax. The heart is normal in size; the mediastinal contour is within normal limits. No acute osseous abnormalities are seen. IMPRESSION: No acute cardiopulmonary process seen. Electronically Signed   By: Roanna RaiderJeffery  Chang M.D.   On: 01/15/2016 19:04     ASSESSMENT AND PLAN: Unstable angina with acute coronary syndrome and abnormal stress test with ischemia in the LAD territory with normal left reticular systolic function. Also has on echocardiogram possible atrial septal defect and vegetation in the right atrium. Advise transesophageal echocardiogram and cardiac catheterization today. Patient was explained procedure (esophageal echocardiogram which will be done first followed by cardiac catheterization and patient agreed to the procedure.  Elizah Mierzwa A

## 2016-01-16 NOTE — Progress Notes (Signed)
[facility]         [place], [place]   01/16/2016  Patient: Samantha Wells   Date of Birth:  01/08/1966  Date of admission:  01/15/2016  Date of Discharge  01/16/2016    To Whom it May Concern:   Samantha Wells  may return to work on 01/17/16.  PHYSICAL ACTIVITY:  Full  If you have any questions or concerns, please don't hesitate to call.  Sincerely,   Altamese DillingVACHHANI, Heddy Vidana M.D Pager [phone] Office : [phone]   .

## 2016-01-16 NOTE — Progress Notes (Signed)
Pt returned from cath lab, R groin site soft, no evidence of hematoma, bleeding or ecchymosis. R distal pulses palpable +2. Pt A&O, VSS, [facility].

## 2016-01-16 NOTE — Progress Notes (Signed)
     Samantha Wells was admitted to the [facility] on 01/15/2016 for an acute medical condition and is being Discharged on  01/16/2016 . She Should be able to return to work without any restrictions from 01/18/16.  Call Samantha Baasadhika Vyron Fronczak  MD, [facility] at  [phone] with questions.  Samantha BaasKALISETTI,Samantha Wells M.D on 01/16/2016,at 7:48 PM  [facility] [address], [place] [id number]

## 2016-01-16 NOTE — Progress Notes (Addendum)
Update given per pt request and permission to son regarding plan of care and patient condition. Pt son thanked rn for update and time.

## 2016-01-16 NOTE — H&P (Signed)
[facility] [name] [facility] Admission History and Physical [employer] Keyshon Stein, D.O.  ---------------------------------------------------------------------------------------------------------------------   PATIENT NAME: Samantha Wells MR#: [id number] DATE OF BIRTH: 12/31/1965 DATE OF ADMISSION: 01/15/2016 PRIMARY CARE PHYSICIAN: No PCP Per Patient  REQUESTING/REFERRING PHYSICIAN: ED Dr. Lenard LancePaduchowski  CHIEF COMPLAINT: Chief Complaint  Patient presents with  . Chest Pain    HISTORY OF PRESENT ILLNESS: Samantha Wells is a 50 y.o. female with a known history of Hypertension, hyperglycemia, hypokalemia and musculoskeletal chest pain was in a usual state of health until around 4 PM tonight when she developed chest discomfort which she describes as central, substernal, nonradiating and made worse if she pushes on the area.she denies any associated shortness of breath, palpitations, N/V, dizziness, light-headedness. 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 The patient does state for the past one week she has had intermittent mild cough as well, denies sputum production and fever.   Of note the patient was medically cleared for discharge from the emergency department after 2 troponins of 0.04 and <0.03 and a nonischemic EKG. However her family members informed the emergency department physician that Dr. Welton FlakesKhan requested that she be admitted to the hospital for catheterization given her recent abnormal outpatient stress test.   Otherwise there has been no change in status. Patient has been taking medication as prescribed and there has been no recent change in medication, diet, or exercise routine.  There has been no recent illness, travel or sick contacts.    Patient denies fevers/chills, weakness, dizziness, shortness of breath, N/V/C/D, abdominal pain, dysuria/frequency,  changes in mental status.   EMS/ED COURSE:    patient received 50 mg of tramadol by mouth   PAST MEDICAL HISTORY: Past Medical History:  Diagnosis Date  . Hypertension       PAST SURGICAL HISTORY: Past Surgical History:  Procedure Laterality Date  . Left Shoulder Repair Left       SOCIAL HISTORY: Social History  Substance Use Topics  . Smoking status: Current Every Day Smoker    Types: Cigarettes  . Smokeless tobacco: Never Used  . Alcohol use No      FAMILY HISTORY: Family History  Problem Relation Age of Onset  . CAD Mother   . Hypertension Sister      MEDICATIONS AT HOME: Prior to Admission medications   Medication Sig Start Date End Date Taking? Authorizing Provider  aspirin 81 MG tablet Take 81 mg by mouth daily.   Yes Historical Provider, MD  pantoprazole (PROTONIX) 40 MG tablet Take 1 tablet (40 mg total) by mouth daily. Switch for any other PPI at similar dose and frequency 01/09/16  Yes Katharina Caperima Vaickute, MD  guaiFENesin-codeine 100-10 MG/5ML syrup Take 5 mLs by mouth every 4 (four) hours as needed for cough. Patient not taking: Reported on 01/15/2016 01/09/16   Katharina Caperima Vaickute, MD  traMADol (ULTRAM) 50 MG tablet Take 1 tablet (50 mg total) by mouth every 6 (six) hours as needed. 01/15/16 01/14/17  Minna AntisKevin Paduchowski, MD      DRUG ALLERGIES: No Known Allergies   REVIEW OF SYSTEMS: CONSTITUTIONAL: No fever/chills, fatigue, weakness, weight gain/loss, headache EYES: No blurry or double vision. ENT: No tinnitus, postnasal drip, redness or soreness of the oropharynx. RESPIRATORY: No cough, wheeze, hemoptysis, dyspnea. CARDIAC: Positive chest pain, orthopnea, palpitations, syncope. GASTROINTESTINAL: No nausea, vomiting, constipation, diarrhea, abdominal pain, hematemesis, melena or hematochezia. GENITOURINARY: No  dysuria or hematuria. ENDOCRINE: No polyuria or nocturia. No heat or cold intolerance. HEMATOLOGY: No anemia, bruising, bleeding. INTEGUMENTARY: No  rashes, ulcers, lesions. MUSCULOSKELETAL: No arthritis, swelling, gout. NEUROLOGIC: No numbness, tingling, weakness or ataxia. No seizure-type activity. PSYCHIATRIC: No anxiety, depression, insomnia.  PHYSICAL EXAMINATION: VITAL SIGNS: Blood pressure (!) 146/82, pulse 68, temperature 98 F (36.7 C), temperature source Oral, resp. rate 20, height 5\' 4"  (1.626 m), weight 73.9 kg (163 lb), last menstrual period 02/23/2015, SpO2 100 %.  GENERAL: 50 y.o.-year-black femalepatient, well-developed, well-nourished lying in the bed in no acute distress.  Pleasant and cooperative.   HEENT: Head atraumatic, normocephalic. Pupils equal, round, reactive to light and accommodation. No scleral icterus. Extraocular muscles intact. Nares are patent. Oropharynx is clear. Mucus membranes moist. NECK: Supple, full range of motion. No JVD, no bruit heard. No thyroid enlargement, no tenderness, no cervical lymphadenopathy. CHEST: Normal breath sounds bilaterally. No wheezing, rales, rhonchi or crackles. No use of accessory muscles of respiration.  Positive central reproducible chest wall tenderness.  CARDIOVASCULAR: S1, S2 normal. No murmurs, rubs, or gallops. Cap refill <2 seconds. ABDOMEN: Soft, nontender, nondistended. No rebound, guarding, rigidity. Normoactive bowel sounds present in all four quadrants. No organomegaly or mass. EXTREMITIES: Full range of motion. No pedal edema, cyanosis, or clubbing. NEUROLOGIC: Cranial nerves II through XII are grossly intact with no focal sensorimotor deficit. Muscle strength 5/5 in all extremities. Sensation intact. Gait not checked. PSYCHIATRIC: The patient is alert and oriented x 3. Normal affect, mood, thought content. SKIN: Warm, dry, and intact without obvious rash, lesion, or ulcer.  LABORATORY PANEL:  CBC  Recent Labs Lab 01/15/16 1736  WBC 8.1  HGB 13.2  HCT 40.0  PLT 214    ----------------------------------------------------------------------------------------------------------------- Chemistries  Recent Labs Lab 01/15/16 1736  NA 138  K 3.7  CL 105  CO2 25  GLUCOSE 109*  BUN 20  CREATININE 0.82  CALCIUM 9.7   ------------------------------------------------------------------------------------------------------------------ Cardiac Enzymes  Recent Labs Lab 01/15/16 2100  TROPONINI <0.03   ------------------------------------------------------------------------------------------------------------------  RADIOLOGY: Dg Chest 2 View  Result Date: 01/15/2016 CLINICAL DATA:  Acute onset of central chest pain and dyspnea. Initial encounter. EXAM: CHEST  2 VIEW COMPARISON:  Chest radiograph performed 01/08/2016 FINDINGS: The lungs are well-aerated and clear. There is no evidence of focal opacification, pleural effusion or pneumothorax. The heart is normal in size; the mediastinal contour is within normal limits. No acute osseous abnormalities are seen. IMPRESSION: No acute cardiopulmonary process seen. Electronically Signed   By: Roanna Raider M.D.   On: 01/15/2016 19:04    EKG: Sinus at 81 bpm with normal axis and no significant ST or T-wave changes   IMPRESSION AND PLAN:  This is a 50 y.o. female with a histohypertension, hypokalemia, hyperglycemia, musculoskeletal chest pain now being admitted with: 1.  Chest pain rule out ACS - patient's chest pain seems to be consistent with musculoskeletal etiology given her history and its reproducibility. However emergency department conversation with cardiologist recommended admission to the hospital for further workup and cardiac catheterization. We will trend her troponins, check lipids and TSH. We'll prepare her for catheterization with beta blocker, Plavix, statin. We'll continue her aspirin We will treat her pain with hydrocodone and morphine as needed. 2. Continue home  medications  Diet/Nutritionothing by mouth in preparation for catheterization Fluids: IV normal saline DVT Px: Lovenox, SCDs and early ambulation Code Status: Full  All the records are reviewed and case discussed with ED provider. Management plans discussed with the patient and/or family  who express understanding and agree with plan of care.   TOTAL TIME TAKING CARE OF THIS PATIENT: 60 minutes.   Yarethzy Croak D.O. on 01/16/2016 at 1:12 AM Between 7am to 6pm - Pager - [phone] After 6pm go to [email or web address] - [email or web address] [email or web address] [facility] Office [phone] CC: Primary care physician; No PCP Per Patient     Note: This dictation was prepared with Dragon dictation along with smaller phrase technology. Any transcriptional errors that result from this process are unintentional.

## 2016-01-16 NOTE — Progress Notes (Signed)
Pt. Discharged to home via wc. Discharge instructions and medication regimen reviewed at bedside with patient and family, in room and over telephone. All verbalize understanding of instructions and medication regimen. Prescriptions & doctors note included with d/c papers. Patient assessment unchanged from this morning. TELE and IV discontinued per policy.

## 2016-01-16 NOTE — Progress Notes (Signed)
A&O. Independent. Admitted from home with CP. IV fluids infusing. No complaints. Probable cardiac cath this AM.

## 2016-01-16 NOTE — Progress Notes (Signed)
Patient underwent right and left heart catheterization which showed normal coronaries, normal left ventricular systolic function, and normal right heart pressures. Saturations were normal as well as pressures on the right heart that there was no evidence of atrial septal or ventricular septal defect. Patient did have very dark arterial blood but the saturation was 95%. In conclusion noncardiac chest pain with no evidence of atrial septal defect on cardiac catheterization as well as TRANSESOPHAGEAL ECHOCARDIOGRAM. THERE IS NO EVIDENCE OF ENDOCARDITIS WITH NO EVIDENCE OF VEGETATION SEEN ON TEE. PATIENT CAN GO HOME WITH FOLLOW-UP IN THE OFFICE AS CHEST PAIN IS MOST LIKELY MUSCULOSKELETAL AND SHORTNESS OF BREATH PROBABLY RELATED TO COPD.

## 2016-01-17 ENCOUNTER — Encounter: Payer: Self-pay | Admitting: Cardiovascular Disease

## 2017-01-03 ENCOUNTER — Emergency Department: Payer: Self-pay

## 2017-01-03 ENCOUNTER — Emergency Department
Admission: EM | Admit: 2017-01-03 | Discharge: 2017-01-04 | Disposition: A | Discharge status: Home / Self Care | Payer: Self-pay | Attending: Emergency Medicine | Admitting: Emergency Medicine

## 2017-01-03 ENCOUNTER — Encounter: Payer: Self-pay | Admitting: Emergency Medicine

## 2017-01-03 DIAGNOSIS — M79642 Pain in left hand: Secondary | ICD-10-CM | POA: Insufficient documentation

## 2017-01-03 DIAGNOSIS — I10 Essential (primary) hypertension: Secondary | ICD-10-CM | POA: Insufficient documentation

## 2017-01-03 DIAGNOSIS — R55 Syncope and collapse: Secondary | ICD-10-CM | POA: Insufficient documentation

## 2017-01-03 DIAGNOSIS — R61 Generalized hyperhidrosis: Secondary | ICD-10-CM | POA: Insufficient documentation

## 2017-01-03 DIAGNOSIS — F17210 Nicotine dependence, cigarettes, uncomplicated: Secondary | ICD-10-CM | POA: Insufficient documentation

## 2017-01-03 DIAGNOSIS — R51 Headache: Secondary | ICD-10-CM | POA: Insufficient documentation

## 2017-01-03 DIAGNOSIS — Z79899 Other long term (current) drug therapy: Secondary | ICD-10-CM | POA: Insufficient documentation

## 2017-01-03 DIAGNOSIS — R079 Chest pain, unspecified: Secondary | ICD-10-CM | POA: Insufficient documentation

## 2017-01-03 DIAGNOSIS — N390 Urinary tract infection, site not specified: Secondary | ICD-10-CM | POA: Insufficient documentation

## 2017-01-03 DIAGNOSIS — Z7982 Long term (current) use of aspirin: Secondary | ICD-10-CM | POA: Insufficient documentation

## 2017-01-03 LAB — TROPONIN I

## 2017-01-03 LAB — BASIC METABOLIC PANEL
Anion gap: 11 (ref 5–15)
BUN: 20 mg/dL (ref 6–20)
CHLORIDE: 104 mmol/L (ref 101–111)
CO2: 22 mmol/L (ref 22–32)
CREATININE: 0.96 mg/dL (ref 0.44–1.00)
Calcium: 10 mg/dL (ref 8.9–10.3)
GFR calc Af Amer: >60 mL/min (ref >60)
GFR calc non Af Amer: >60 mL/min (ref >60)
Glucose, Bld: 99 mg/dL (ref 65–99)
POTASSIUM: 3.2 mmol/L — AB (ref 3.5–5.1)
SODIUM: 137 mmol/L (ref 135–145)

## 2017-01-03 LAB — URINALYSIS, COMPLETE (UACMP) WITH MICROSCOPIC
BACTERIA UA: NONE SEEN
BILIRUBIN URINE: NEGATIVE
Glucose, UA: NEGATIVE mg/dL
Hgb urine dipstick: NEGATIVE
Ketones, ur: NEGATIVE mg/dL
Nitrite: NEGATIVE
PH: 5 (ref 5.0–8.0)
PROTEIN: NEGATIVE mg/dL
Specific Gravity, Urine: 1.016 (ref 1.005–1.030)

## 2017-01-03 LAB — CBC
HEMATOCRIT: 39.9 % (ref 35.0–47.0)
Hemoglobin: 13.4 g/dL (ref 12.0–16.0)
MCH: 30.5 pg (ref 26.0–34.0)
MCHC: 33.5 g/dL (ref 32.0–36.0)
MCV: 91.1 fL (ref 80.0–100.0)
PLATELETS: 250 10*3/uL (ref 150–440)
RBC: 4.38 MIL/uL (ref 3.80–5.20)
RDW: 13.7 % (ref 11.5–14.5)
WBC: 9.1 10*3/uL (ref 3.6–11.0)

## 2017-01-03 LAB — GLUCOSE, CAPILLARY: GLUCOSE-CAPILLARY: 93 mg/dL (ref 65–99)

## 2017-01-03 MED ORDER — FOSFOMYCIN TROMETHAMINE 3 G PO PACK
3.0000 g | PACK | Freq: Once | ORAL | Status: AC
  Administered 2017-01-03: 3 g via ORAL
  Filled 2017-01-03: qty 3

## 2017-01-03 NOTE — Discharge Instructions (Signed)
Please follow-up your doctor soon as possible for recheck/reevaluation. Return to the emergency department for any worsening chest pain, trouble breathing, or any other symptom personally concerning to Yourself.

## 2017-01-03 NOTE — ED Triage Notes (Signed)
Pt to triage via EMS from home, EMS report pt c/o weakness, headache, and syncopal episodes x several weeks, pt reports 3-4 episodes a day.  EMS report pt tachycardic en route, HR 88 in triage.  Pt c/o headache as well.  Pt c/o diaphoresis, pt skin warm and dry at this time.  Pt in NAD at this time

## 2017-01-03 NOTE — ED Provider Notes (Signed)
[facility] Emergency Department Provider Note  Time seen: 9:28 PM  I have reviewed the triage vital signs and the nursing notes.   HISTORY  Chief Complaint Weakness; Headache; and Loss of Consciousness    HPI Samantha Wells is a 51 y.o. female with a past medical history of hypertension, chest pain, presents to the emergency department for multiple complaints. Patient states for the past 3 or 4 months she has been having intermittent sweating. She states over the past several days she has been having chest pain in the center of her chest. Denies any nausea or shortness of breath. She states today she has passed out 2 or 3 times. Triage nurse reports she told her she has been passing out multiple times per day for several weeks. Patient tells me she passed out 2 or 3 times today for the first time ever. Patient is also complaining of intermittent headache for the past several weeks. She did not mention to triage nurse but did mention to myself that she is feeling difficulty using her left fingers which she states she believes started today but could've been yesterday. Patient denies fever, cough or congestion. Denies vomiting or diarrhea. Denies dysuria. States she has been feeling weak for multiple months. Patient states she has not followed up with any physician regarding her symptoms.  Past Medical History:  Diagnosis Date  . Hypertension     Patient Active Problem List   Diagnosis Date Noted  . Chest pain, rule out acute myocardial infarction 01/16/2016  . Chest pain 01/16/2016  . Musculoskeletal chest pain 01/09/2016  . Abdominal pain, epigastric 01/09/2016  . Essential hypertension, benign 01/09/2016  . Hypokalemia 01/09/2016  . Hyperglycemia 01/09/2016    Past Surgical History:  Procedure Laterality Date  . CARDIAC CATHETERIZATION N/A 01/16/2016   Procedure: Right/Left Heart Cath and Coronary Angiography;  Surgeon: Laurier NancyShaukat A Khan, MD;  Location: [facility]  INVASIVE CV LAB;  Service: Cardiovascular;  Laterality: N/A;  . Left Shoulder Repair Left     Prior to Admission medications   Medication Sig Start Date End Date Taking? Authorizing Provider  aspirin 81 MG tablet Take 81 mg by mouth daily.    [provider]  guaiFENesin-codeine 100-10 MG/5ML syrup Take 5 mLs by mouth every 4 (four) hours as needed for cough. Patient not taking: Reported on 01/15/2016 01/09/16   Katharina CaperVaickute, Rima, MD  metoprolol succinate (TOPROL-XL) 25 MG 24 hr tablet Take 0.5 tablets (12.5 mg total) by mouth daily. 01/16/16   Enid BaasKalisetti, Radhika, MD  pantoprazole (PROTONIX) 40 MG tablet Take 1 tablet (40 mg total) by mouth daily. Switch for any other PPI at similar dose and frequency 01/09/16   Katharina CaperVaickute, Rima, MD  traMADol (ULTRAM) 50 MG tablet Take 1 tablet (50 mg total) by mouth every 6 (six) hours as needed. 01/16/16 01/15/17  Enid BaasKalisetti, Radhika, MD    No Known Allergies  Family History  Problem Relation Age of Onset  . CAD Mother   . Hypertension Sister     Social History Social History  Substance Use Topics  . Smoking status: Current Every Day Smoker    Types: Cigarettes  . Smokeless tobacco: Never Used  . Alcohol use No    Review of Systems Constitutional: Negative for fever. Cardiovascular: Intermittent chest pain Respiratory: Negative for shortness of breath. Gastrointestinal: Negative for abdominal pain, vomiting and diarrhea. Genitourinary: Negative for dysuria. Musculoskeletal: Left hand pain Skin: Intermittent diaphoresis Neurological: Negative for headaches, focal weakness or numbness. All other ROS  negative  ____________________________________________   PHYSICAL EXAM:  VITAL SIGNS: ED Triage Vitals [01/03/17 1926]  Enc Vitals Group     BP (!) 150/86     Pulse Rate 86     Resp 16     Temp 98.6 F (37 C)     Temp Source Oral     SpO2 100 %     Weight 159 lb (72.1 kg)     Height 5\' 2"  (1.575 m)     Head Circumference      Peak  Flow      Pain Score 6     Pain Loc      Pain Edu?      Excl. in GC?     Constitutional: Alert and oriented. Well appearing and in no distress. Eyes: Normal exam ENT   Head: Normocephalic and atraumatic   Mouth/Throat: Mucous membranes are moist. Cardiovascular: Normal rate, regular rhythm. No murmur Respiratory: Normal respiratory effort without tachypnea nor retractions. Breath sounds are clear  Gastrointestinal: Soft and nontender. No distention. Musculoskeletal: Nontender with normal range of motion in all extremities. Mild tenderness of the left hand. Neurologic:  Normal speech and language. Patient has decreased grip strength and left him which she states is due to pain with attempted squeeze. Cannot tell me how long this has been going on for. Skin:  Skin is warm, dry and intact.  Psychiatric: Mood and affect are normal.   ____________________________________________    EKG  EKG reviewed and interpreted by myself shows normal sinus rhythm at 81 bpm, narrow QRS, normal axis, slightly prolonged QTC of 501 ms, no ST changes.  ____________________________________________    RADIOLOGY  X-ray and CT negative  ____________________________________________   INITIAL IMPRESSION / ASSESSMENT AND PLAN / ED COURSE  Pertinent labs & imaging results that were available during my care of the patient were reviewed by me and considered in my medical decision making (see chart for details).  Patient presents with multiple complaints including diaphoresis, weakness, chest pain, syncope, left hand pain and weakness. Patient's symptoms range from today some symptoms are multiple months. Currently the patient appears very well, no distress. She is calm and cooperative. Her chest pain is very reproducible. In reviewing the patient's chart she was admitted 2017 for chest pain had a positive stress test and a normal cardiac catheterization and echocardiogram. Patient's lab work here  shows negative troponin, EKG is reassuring. We will obtain a repeat troponin, chest x-ray. Given the patient's left hand symptoms for the past 2 days we'll obtain a CT scan of the head. Given the patient's multiple complaints over multiple times. It is difficult to ascertain exactly what brought the patient to the emergency department, when asked she states everything. When asked what her main complaint today is she states she wants to know why she has been sweating intermittently over the past several months. Patient states her last pressure. Was approximately 2 years ago. I doubt the diaphoresis is hormonal/menopause-related. Unfortunately I do not believe we will be able to provide the patient with a direct cause of her symptoms as far as the diaphoresis.   Patient's repeat troponin is negative. Urinalysis does show moderate white blood cells we'll cover the one-time dose of fosfomycin and send a urine culture.  CT head negative Chest x-ray negative  We will discharge the patient home with PCP follow-up. Patient agreeable to plan.  ____________________________________________   FINAL CLINICAL IMPRESSION(S) / ED DIAGNOSES  Chest pain Diaphoresis Syncope Weakness Hand pain  Urinary tract infection    Minna AntisPaduchowski, Leon Goodnow, MD 01/03/17 2320

## 2017-01-05 LAB — URINE CULTURE

## 2017-02-09 ENCOUNTER — Emergency Department: Payer: PRIVATE HEALTH INSURANCE

## 2017-02-09 ENCOUNTER — Emergency Department
Admission: EM | Admit: 2017-02-09 | Discharge: 2017-02-09 | Disposition: A | Discharge status: Home / Self Care | Payer: PRIVATE HEALTH INSURANCE | Attending: Emergency Medicine | Admitting: Emergency Medicine

## 2017-02-09 DIAGNOSIS — I10 Essential (primary) hypertension: Secondary | ICD-10-CM | POA: Insufficient documentation

## 2017-02-09 DIAGNOSIS — F17210 Nicotine dependence, cigarettes, uncomplicated: Secondary | ICD-10-CM | POA: Insufficient documentation

## 2017-02-09 DIAGNOSIS — Z79899 Other long term (current) drug therapy: Secondary | ICD-10-CM | POA: Insufficient documentation

## 2017-02-09 DIAGNOSIS — M659 Synovitis and tenosynovitis, unspecified: Secondary | ICD-10-CM | POA: Insufficient documentation

## 2017-02-09 DIAGNOSIS — Z7982 Long term (current) use of aspirin: Secondary | ICD-10-CM | POA: Insufficient documentation

## 2017-02-09 LAB — CBC
HEMATOCRIT: 36.8 % (ref 35.0–47.0)
HEMOGLOBIN: 12.8 g/dL (ref 12.0–16.0)
MCH: 31.6 pg (ref 26.0–34.0)
MCHC: 34.7 g/dL (ref 32.0–36.0)
MCV: 91 fL (ref 80.0–100.0)
Platelets: 277 10*3/uL (ref 150–440)
RBC: 4.04 MIL/uL (ref 3.80–5.20)
RDW: 13.6 % (ref 11.5–14.5)
WBC: 8.7 10*3/uL (ref 3.6–11.0)

## 2017-02-09 MED ORDER — CLINDAMYCIN PHOSPHATE 600 MG/50ML IV SOLN
600.0000 mg | Freq: Once | INTRAVENOUS | Status: DC
  Filled 2017-02-09: qty 50

## 2017-02-09 MED ORDER — CEFTRIAXONE SODIUM 1 G IJ SOLR: 1.0000 g | Freq: Once | INTRAMUSCULAR | Status: DC

## 2017-02-09 MED ORDER — NAPROXEN 500 MG PO TABS
500.0000 mg | ORAL_TABLET | Freq: Two times a day (BID) | ORAL | 0 refills | Status: DC
Start: 2017-02-09 — End: 2017-05-22

## 2017-02-09 MED ORDER — CLINDAMYCIN PHOSPHATE 600 MG/4ML IJ SOLN
600.0000 mg | Freq: Once | INTRAMUSCULAR | Status: AC
  Administered 2017-02-09: 600 mg via INTRAMUSCULAR
  Filled 2017-02-09: qty 4

## 2017-02-09 MED ORDER — CLINDAMYCIN HCL 300 MG PO CAPS: 300.0000 mg | ORAL_CAPSULE | Freq: Three times a day (TID) | ORAL | 0 refills | Status: AC

## 2017-02-09 MED ORDER — VANCOMYCIN HCL IN DEXTROSE 1-5 GM/200ML-% IV SOLN: 1000.0000 mg | Freq: Once | INTRAVENOUS | Status: DC

## 2017-02-09 NOTE — ED Notes (Signed)
Attempted IV x 2 without success.  Provider notified.

## 2017-02-09 NOTE — ED Triage Notes (Signed)
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

## 2017-02-09 NOTE — ED Notes (Signed)
Attempted IV access x1, unsuccessful. Will ask other RN to assess.

## 2017-02-09 NOTE — ED Notes (Signed)

## 2017-02-09 NOTE — ED Provider Notes (Signed)
-----------------------------------------   8:38 PM on 02/09/2017 -----------------------------------------  Since 2 weeks of hand pain and swelling in her thumb which she uses repetitively. The thumb is mildly swollen, however, there is no evidence of infection is not red is not hot to touch there is no pain at the flexor or extensor tendon, there is reproducible pain when she ranges it. Very low suspicion for flexor tenosynovitis or infection but we will treat empirically with clindamycin as a precaution. Patient's white count is normal. Gradual onset pain for 2 weeks with very clear vesicle skeletal component reassuring x-ray, unclear exact etiology but again do not suspect the patient needs surgery for emergent decompensation of the extremity. Extensive return precautions and follow-up given and understood. We will place her in a sling for comfort but we have asked her to take the splint down regularly to ensure that there is no pathology progressing and we have asked her to be seen tomorrow if possible by primary care and orthopedic surgery. Patient does understand she must come back for fever or increased pain and redness or swelling that gets worse. However at this time she has no evidence of infection on exam. No fever, negative white count etc. gouty arthritis is possible as is a tendinitis which I favor as a most likely diagnosis   Jeanmarie PlantMcShane, Raysean Graumann A, MD 02/09/17 2040

## 2017-02-09 NOTE — ED Provider Notes (Signed)
  Physical Exam  BP (!) 172/90 (BP Location: Right Arm)   Pulse 70   Temp 98.2 F (36.8 C) (Oral)   Resp 18   Ht 4\' 9"  (1.448 m)   Wt 72.6 kg (160 lb)   LMP 02/23/2015   SpO2 100%   BMI 34.62 kg/m   Physical Exam  ED Course  Procedures  Patient's white blood cell count was reassuring. Patient was discharged with clindamycin and advised to follow-up with Dr. Martha ClanKrasinski to rule out possible flexor tenosynovitis. Dr. Alphonzo LemmingsMcShane was consulted regarding patient's case and agrees with plan of care.       Pia MauWoods, Jaclyn RockvilleM, PA-C 02/09/17 2123    Jeanmarie PlantMcShane, James A, MD 02/09/17 2222

## 2017-02-09 NOTE — ED Provider Notes (Signed)
[facility] Emergency Department Provider Note  ____________________________________________  Time seen: Approximately 5:36 PM  I have reviewed the triage vital signs and the nursing notes.   HISTORY  Chief Complaint Hand Pain    HPI Samantha Wells is a 51 y.o. female that presents to the emergency department for evaluation of left thumb swelling for 2 weeks that has gotten worse in the last 2 days. Pain hurts with any movement. No alleviating measures have been attempted. She works in a Surveyor, mining. No history of gout. No fever, shortness of breath, chest pain, nausea, vomiting, abdominal pain, numbness, tingling.   Past Medical History:  Diagnosis Date  . Hypertension     Patient Active Problem List   Diagnosis Date Noted  . Chest pain, rule out acute myocardial infarction 01/16/2016  . Chest pain 01/16/2016  . Musculoskeletal chest pain 01/09/2016  . Abdominal pain, epigastric 01/09/2016  . Essential hypertension, benign 01/09/2016  . Hypokalemia 01/09/2016  . Hyperglycemia 01/09/2016    Past Surgical History:  Procedure Laterality Date  . CARDIAC CATHETERIZATION N/A 01/16/2016   Procedure: Right/Left Heart Cath and Coronary Angiography;  Surgeon: Laurier Nancy, MD;  Location: [facility] INVASIVE CV LAB;  Service: Cardiovascular;  Laterality: N/A;  . Left Shoulder Repair Left     Prior to Admission medications   Medication Sig Start Date End Date Taking? Authorizing Provider  aspirin 81 MG tablet Take 81 mg by mouth daily.    [provider]  clindamycin (CLEOCIN) 300 MG capsule Take 1 capsule (300 mg total) by mouth 3 (three) times daily. 02/09/17 02/19/17  Enid Derry, PA-C  guaiFENesin-codeine 100-10 MG/5ML syrup Take 5 mLs by mouth every 4 (four) hours as needed for cough. Patient not taking: Reported on 01/15/2016 01/09/16   Katharina Caper, MD  metoprolol succinate (TOPROL-XL) 25 MG 24 hr tablet Take 0.5 tablets (12.5 mg total) by mouth  daily. 01/16/16   Enid Baas, MD  naproxen (NAPROSYN) 500 MG tablet Take 1 tablet (500 mg total) by mouth 2 (two) times daily with a meal. 02/09/17 02/09/18  Enid Derry, PA-C  pantoprazole (PROTONIX) 40 MG tablet Take 1 tablet (40 mg total) by mouth daily. Switch for any other PPI at similar dose and frequency 01/09/16   Katharina Caper, MD    Allergies Patient has no known allergies.  Family History  Problem Relation Age of Onset  . CAD Mother   . Hypertension Sister     Social History Social History  Substance Use Topics  . Smoking status: Current Every Day Smoker    Types: Cigarettes  . Smokeless tobacco: Never Used  . Alcohol use No     Review of Systems  Constitutional: No fever/chills Cardiovascular: No chest pain. Respiratory: No SOB. Gastrointestinal: No abdominal pain.  No nausea, no vomiting.  Musculoskeletal: Positive for thumb pain. Skin: Negative for rash, abrasions, lacerations, ecchymosis. Neurological: Negative for headaches, numbness or tingling   ____________________________________________   PHYSICAL EXAM:  VITAL SIGNS: ED Triage Vitals  Enc Vitals Group     BP 02/09/17 1710 (!) 155/87     Pulse Rate 02/09/17 1710 72     Resp --      Temp 02/09/17 1710 98.2 F (36.8 C)     Temp Source 02/09/17 1710 Oral     SpO2 02/09/17 1710 100 %     Weight 02/09/17 1711 160 lb (72.6 kg)     Height 02/09/17 1711  (1.448 m)     Head Circumference --  Peak Flow --      Pain Score 02/09/17 1713 9     Pain Loc --      Pain Edu? --      Excl. in GC? --      Constitutional: Alert and oriented. Well appearing and in no acute distress. Eyes: Conjunctivae are normal. PERRL. EOMI. Head: Atraumatic. ENT:      Ears:      Nose: No congestion/rhinnorhea.      Mouth/Throat: Mucous membranes are moist.  Neck: No stridor.   Cardiovascular: Normal rate, regular rhythm.  Good peripheral circulation. 2+ radial pulses. Respiratory: Normal respiratory  effort without tachypnea or retractions. Lungs CTAB. Good air entry to the bases with no decreased or absent breath sounds. Musculoskeletal: Mild swelling of the thumb at the base. No tenderness to palpation over the flexor tendon sheath. Pain over IP joint. No erythema.  Neurologic:  Normal speech and language. No gross focal neurologic deficits are appreciated.  Skin:  Skin is warm, dry and intact. No rash noted.   ____________________________________________   LABS (all labs ordered are listed, but only abnormal results are displayed)  Labs Reviewed  CBC   ____________________________________________  EKG   ____________________________________________  RADIOLOGY Lexine BatonI, Loella Hickle, personally viewed and evaluated these images (plain radiographs) as part of my medical decision making, as well as reviewing the written report by the radiologist.  Dg Hand Complete Left  Result Date: 02/09/2017 CLINICAL DATA:  Thumb swelling. Left hand pain and swelling for 2 weeks, worse over the past 2 days. No known injury. EXAM: LEFT HAND - COMPLETE 3+ VIEW COMPARISON:  None. FINDINGS: There is no evidence of fracture or dislocation. There is no evidence of arthropathy or other focal bone abnormality. Soft tissue edema of the thumb. No soft tissue air. No radiopaque foreign body. IMPRESSION: Soft tissue edema of the thumb.  No osseous abnormality. Electronically Signed   By: Rubye OaksMelanie  Ehinger M.D.   On: 02/09/2017 18:04    ____________________________________________    PROCEDURES  Procedure(s) performed:    Procedures    Medications  clindamycin (CLEOCIN) IVPB 600 mg (not administered)     ____________________________________________   INITIAL IMPRESSION / ASSESSMENT AND PLAN / ED COURSE  Pertinent labs & imaging results that were available during my care of the patient were reviewed by me and considered in my medical decision making (see chart for details).  Review of the [place] CSRS  was performed in accordance of the [facility] prior to dispensing any controlled drugs.     Patient presented to the emergency department for evaluation of left thumb swelling. Vital signs and exam are reassuring. Patient is afebrile. X-ray indicates edema. Dr. Alphonzo LemmingsMcShane was consulted and recommended that patient be given clindamycin and follow up with orthopedics tomorrow for possible gout or tenosynovitis. Patient was given IM Clindamycin. [facility] of a reassuring CBC. Lab work was ordered.   ____________________________________________  FINAL CLINICAL IMPRESSION(S) / ED DIAGNOSES  Final diagnoses:  Tenosynovitis      NEW MEDICATIONS STARTED DURING THIS VISIT:  New Prescriptions   CLINDAMYCIN (CLEOCIN) 300 MG CAPSULE    Take 1 capsule (300 mg total) by mouth 3 (three) times daily.   NAPROXEN (NAPROSYN) 500 MG TABLET    Take 1 tablet (500 mg total) by mouth 2 (two) times daily with a meal.        This chart was dictated using voice recognition software/Dragon. Despite best efforts to proofread, errors can  occur which can change the meaning. Any change was purely unintentional.    Enid Derry, PA-C 02/09/17 1941    Jeanmarie Plant, MD 02/09/17 2222

## 2017-05-22 ENCOUNTER — Emergency Department
Admission: EM | Admit: 2017-05-22 | Discharge: 2017-05-22 | Disposition: A | Discharge status: Home / Self Care | Payer: PRIVATE HEALTH INSURANCE | Attending: Emergency Medicine | Admitting: Emergency Medicine

## 2017-05-22 DIAGNOSIS — Z79899 Other long term (current) drug therapy: Secondary | ICD-10-CM | POA: Insufficient documentation

## 2017-05-22 DIAGNOSIS — Y939 Activity, unspecified: Secondary | ICD-10-CM | POA: Insufficient documentation

## 2017-05-22 DIAGNOSIS — Y929 Unspecified place or not applicable: Secondary | ICD-10-CM | POA: Insufficient documentation

## 2017-05-22 DIAGNOSIS — Y999 Unspecified external cause status: Secondary | ICD-10-CM | POA: Insufficient documentation

## 2017-05-22 DIAGNOSIS — I10 Essential (primary) hypertension: Secondary | ICD-10-CM | POA: Insufficient documentation

## 2017-05-22 DIAGNOSIS — S46912A Strain of unspecified muscle, fascia and tendon at shoulder and upper arm level, left arm, initial encounter: Secondary | ICD-10-CM | POA: Insufficient documentation

## 2017-05-22 DIAGNOSIS — F17210 Nicotine dependence, cigarettes, uncomplicated: Secondary | ICD-10-CM | POA: Insufficient documentation

## 2017-05-22 DIAGNOSIS — X500XXA Overexertion from strenuous movement or load, initial encounter: Secondary | ICD-10-CM | POA: Insufficient documentation

## 2017-05-22 DIAGNOSIS — Z7982 Long term (current) use of aspirin: Secondary | ICD-10-CM | POA: Insufficient documentation

## 2017-05-22 MED ORDER — CYCLOBENZAPRINE HCL 10 MG PO TABS
5.0000 mg | ORAL_TABLET | Freq: Once | ORAL | Status: AC
  Administered 2017-05-22: 5 mg via ORAL
  Filled 2017-05-22: qty 1

## 2017-05-22 MED ORDER — MELOXICAM 15 MG PO TABS: 15.0000 mg | ORAL_TABLET | Freq: Every day | ORAL | 1 refills | Status: AC

## 2017-05-22 MED ORDER — NAPROXEN 500 MG PO TABS
500.0000 mg | ORAL_TABLET | Freq: Once | ORAL | Status: AC
  Administered 2017-05-22: 500 mg via ORAL
  Filled 2017-05-22: qty 1

## 2017-05-22 MED ORDER — CYCLOBENZAPRINE HCL 5 MG PO TABS: 5.0000 mg | ORAL_TABLET | Freq: Three times a day (TID) | ORAL | 0 refills | Status: DC | PRN
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
## 2017-05-22 NOTE — ED Provider Notes (Signed)
[facility] Emergency Department Provider Note ____________________________________________  Time seen: 1340  I have reviewed the triage vital signs and the nursing notes.  HISTORY  Chief Complaint  Arm Pain  HPI Samantha Wells is a 51 y.o. female presents to the ED for evaluation of left arm pain at the shoulder primarily.  She gives a remote history of a rotator cuff repair with tendon anchor in 2013.  She states over the last few days she has had increased pain to the left shoulder related to her work activities.  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  She is on aware of any acute work-related injury.  She does note in the past she has had intermittent flares of her left shoulder tendinitis and has been placed on out of work restrictions and given an arm sling for comfort.  She denies any current anti-inflammatory therapy for her shoulder.  She has not been evaluated or seen by orthopedics in at least 2 years.  She denies any direct trauma, fall, injury, or accident.  She also denies any distal paresthesias, chest pain, shortness of breath.  She rates her pain at a 9/10 during the exam.  Past Medical History:  Diagnosis Date  . Hypertension     Patient Active Problem List   Diagnosis Date Noted  . Chest pain, rule out acute myocardial infarction 01/16/2016  . Chest pain 01/16/2016  . Musculoskeletal chest pain 01/09/2016  . Abdominal pain, epigastric 01/09/2016  . Essential hypertension, benign 01/09/2016  . Hypokalemia 01/09/2016  . Hyperglycemia 01/09/2016    Past Surgical History:  Procedure Laterality Date  . CARDIAC CATHETERIZATION N/A 01/16/2016   Procedure: Right/Left Heart Cath and Coronary Angiography;  Surgeon: Laurier NancyShaukat A Khan, MD;  Location: [facility] INVASIVE CV LAB;  Service: Cardiovascular;  Laterality: N/A;  . CESAREAN SECTION    . Left Shoulder Repair Left   . TUBAL LIGATION      Prior to  Admission medications   Medication Sig Start Date End Date Taking? Authorizing Provider  calcium carbonate (OS-CAL - DOSED IN MG OF ELEMENTAL CALCIUM) 1250 (500 Ca) MG tablet Take 1 tablet by mouth daily with breakfast.   Yes [provider]  ranitidine (ZANTAC) 75 MG tablet Take 75 mg by mouth at bedtime.   Yes [provider]  aspirin 81 MG tablet Take 81 mg by mouth daily.    [provider]  cyclobenzaprine (FLEXERIL) 5 MG tablet Take 1 tablet (5 mg total) by mouth 3 (three) times daily as needed for muscle spasms. 05/22/17   Tesa Meadors, Charlesetta IvoryJenise V Bacon, PA-C  meloxicam (MOBIC) 15 MG tablet Take 1 tablet (15 mg total) by mouth daily. 05/22/17 07/21/17  Alisa Stjames, Charlesetta IvoryJenise V Bacon, PA-C    Allergies Patient has no known allergies.  Family History  Problem Relation Age of Onset  . CAD Mother   . Hypertension Sister     Social History Social History   Tobacco Use  . Smoking status: Current Every Day Smoker    Types: Cigarettes  . Smokeless tobacco: Never Used  Substance Use Topics  . Alcohol use: No  . Drug use: No    Review of Systems  Constitutional: Negative for fever. Cardiovascular: Negative for chest pain. Respiratory: Negative for shortness of breath. Musculoskeletal: Negative for back pain. Left shoulder pain as above.  Skin: Negative for rash. Neurological: Negative for headaches, focal weakness or numbness. ____________________________________________  PHYSICAL EXAM:  VITAL SIGNS: ED Triage Vitals [05/22/17 1330]  Enc Vitals Group     BP (!) 166/86     Pulse Rate 79     Resp 16     Temp 98.2 F (36.8 C)     Temp Source Oral     SpO2 100 %     Weight 159 lb (72.1 kg)     Height      Head Circumference      Peak Flow      Pain Score 10     Pain Loc      Pain Edu?      Excl. in GC?    Constitutional: Alert and oriented. Well appearing and in no distress. Head: Normocephalic and atraumatic. Neck: Supple. No  thyromegaly. Cardiovascular: Normal rate, regular rhythm. Normal distal pulses. Respiratory: Normal respiratory effort. No wheezes/rales/rhonchi. Musculoskeletal: Left shoulder without any obvious deformity, dislocation, or sulcus sign.  Patient with normal active range of motion in the shoulder. Rotator cuff testing is full and intact bilaterally.  Nontender with normal range of motion in all other extremities.  Neurologic: CN II-XII grossly intact. Normal gait without ataxia. Normal speech and language. No gross focal neurologic deficits are appreciated. Skin:  Skin is warm, dry and intact. No rash noted. ____________________________________________   RADIOLOGY  Not indicated ____________________________________________  PROCEDURES  Procedures Flexeril 5 mg PO Naproxen 500 mg PO ____________________________________________  INITIAL IMPRESSION / ASSESSMENT AND PLAN / ED COURSE  Patient with ED evaluation of chronic, intermittent left shoulder pain.  Patient with a history of a prior rotator cuff repair with tendon anchoring, presents with left shoulder pain over the last 2 days.  She denies any benefit with taking over-the-counter ibuprofen.  Her exam is overall benign without any acute internal derangement or rotator cuff tear suspected.  Her symptoms are more consistent with a shoulder strain and tendinitis.  She is discharged with prescriptions for Flexeril and meloxicam.  She is referred to orthopedics for ongoing symptom management.  Work note is provided for 2 days as requested. ____________________________________________  FINAL CLINICAL IMPRESSION(S) / ED DIAGNOSES  Final diagnoses:  Shoulder strain, left, initial encounter      Lissa HoardMenshew, Whittley Carandang V Bacon, PA-C 05/22/17 1532    Jene EveryKinner, Robert, MD 05/22/17 [id number]

## 2017-05-22 NOTE — Discharge Instructions (Signed)
Your exam is consistent with a strain to the shoulder. You have some tendinitis but no indication of dislocation. Take the prescription meds as directed. Follow-up with Cranston Neighborhris Gaines, [facility] for continued symptoms.

## 2017-05-22 NOTE — ED Notes (Signed)
Pt states had tendons attached to rotator in 2013. Pain started around 3 day ago, unable to lay on left side, or lift arm above shoulder. Sensation is intact in both right and left arms

## 2017-06-12 ENCOUNTER — Emergency Department (HOSPITAL_COMMUNITY): Payer: PRIVATE HEALTH INSURANCE

## 2017-06-12 ENCOUNTER — Encounter (HOSPITAL_COMMUNITY): Payer: Self-pay | Admitting: Emergency Medicine

## 2017-06-12 ENCOUNTER — Other Ambulatory Visit: Payer: Self-pay

## 2017-06-12 ENCOUNTER — Emergency Department (HOSPITAL_COMMUNITY)
Admission: EM | Admit: 2017-06-12 | Discharge: 2017-06-12 | Disposition: A | Discharge status: Home / Self Care | Payer: PRIVATE HEALTH INSURANCE | Attending: Emergency Medicine | Admitting: Emergency Medicine

## 2017-06-12 DIAGNOSIS — S46912A Strain of unspecified muscle, fascia and tendon at shoulder and upper arm level, left arm, initial encounter: Secondary | ICD-10-CM | POA: Insufficient documentation

## 2017-06-12 DIAGNOSIS — Z87891 Personal history of nicotine dependence: Secondary | ICD-10-CM | POA: Insufficient documentation

## 2017-06-12 DIAGNOSIS — Z79899 Other long term (current) drug therapy: Secondary | ICD-10-CM | POA: Insufficient documentation

## 2017-06-12 DIAGNOSIS — Y929 Unspecified place or not applicable: Secondary | ICD-10-CM | POA: Insufficient documentation

## 2017-06-12 DIAGNOSIS — Y939 Activity, unspecified: Secondary | ICD-10-CM | POA: Insufficient documentation

## 2017-06-12 DIAGNOSIS — I10 Essential (primary) hypertension: Secondary | ICD-10-CM | POA: Insufficient documentation

## 2017-06-12 DIAGNOSIS — S46912D Strain of unspecified muscle, fascia and tendon at shoulder and upper arm level, left arm, subsequent encounter: Secondary | ICD-10-CM

## 2017-06-12 DIAGNOSIS — Z7982 Long term (current) use of aspirin: Secondary | ICD-10-CM | POA: Insufficient documentation

## 2017-06-12 DIAGNOSIS — X58XXXA Exposure to other specified factors, initial encounter: Secondary | ICD-10-CM | POA: Insufficient documentation

## 2017-06-12 DIAGNOSIS — Y999 Unspecified external cause status: Secondary | ICD-10-CM | POA: Insufficient documentation

## 2017-06-12 MED ORDER — LIDOCAINE 5 % EX PTCH: 1.0000 | MEDICATED_PATCH | CUTANEOUS | 0 refills | Status: DC

## 2017-06-12 MED ORDER — METHOCARBAMOL 500 MG PO TABS: 500.0000 mg | ORAL_TABLET | Freq: Four times a day (QID) | ORAL | 0 refills | Status: AC

## 2017-06-12 MED ORDER — PREDNISONE 10 MG PO TABS: ORAL_TABLET | ORAL | 0 refills | Status: DC

## 2017-06-12 NOTE — ED Triage Notes (Signed)
Patient c/o left shoulder pain. Per patient pain in shoulder for 2 weeks. [facility] and told strain due to heavy lifting at work. Patient given meloxicam and and flexeril but denies any improvement in pain. Per patient injured shoulder in 2013 and has intermittent pain. Pain worse with movement.
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
## 2017-06-12 NOTE — ED Notes (Signed)
EDP at bedside  

## 2017-06-12 NOTE — Discharge Instructions (Addendum)
Your xrays are negative for acute injury or any movement or failure of the hardware from your surgery. Wear the sling for resting the shoulder. Apply heat as discussed 20 minutes several times daily.  You may also try applying ice if this seems to help. Use the medicines prescribed. Try taking robaxin in place of the flexeril as this may provide better muscle spasm relief.

## 2018-04-25 ENCOUNTER — Emergency Department
Admission: EM | Admit: 2018-04-25 | Discharge: 2018-04-26 | Disposition: A | Discharge status: Home / Self Care | Payer: Self-pay | Attending: Emergency Medicine | Admitting: Emergency Medicine

## 2018-04-25 ENCOUNTER — Other Ambulatory Visit: Payer: Self-pay

## 2018-04-25 ENCOUNTER — Encounter: Payer: Self-pay | Admitting: Emergency Medicine

## 2018-04-25 ENCOUNTER — Emergency Department: Payer: Self-pay

## 2018-04-25 DIAGNOSIS — Z79899 Other long term (current) drug therapy: Secondary | ICD-10-CM | POA: Insufficient documentation

## 2018-04-25 DIAGNOSIS — Z87891 Personal history of nicotine dependence: Secondary | ICD-10-CM | POA: Insufficient documentation

## 2018-04-25 DIAGNOSIS — R0789 Other chest pain: Secondary | ICD-10-CM | POA: Insufficient documentation

## 2018-04-25 DIAGNOSIS — Z7982 Long term (current) use of aspirin: Secondary | ICD-10-CM | POA: Insufficient documentation

## 2018-04-25 DIAGNOSIS — I10 Essential (primary) hypertension: Secondary | ICD-10-CM | POA: Insufficient documentation

## 2018-04-25 DIAGNOSIS — R079 Chest pain, unspecified: Secondary | ICD-10-CM

## 2018-04-25 LAB — BASIC METABOLIC PANEL
Anion gap: 7 (ref 5–15)
BUN: 11 mg/dL (ref 6–20)
CALCIUM: 9.1 mg/dL (ref 8.9–10.3)
CO2: 27 mmol/L (ref 22–32)
Chloride: 105 mmol/L (ref 98–111)
Creatinine, Ser: 0.82 mg/dL (ref 0.44–1.00)
GFR calc Af Amer: >60 mL/min (ref >60)
GLUCOSE: 90 mg/dL (ref 70–99)
Potassium: 3.8 mmol/L (ref 3.5–5.1)
SODIUM: 139 mmol/L (ref 135–145)

## 2018-04-25 LAB — ETHANOL

## 2018-04-25 LAB — HEPATIC FUNCTION PANEL
ALT: 12 U/L (ref 0–44)
AST: 26 U/L (ref 15–41)
Albumin: 4 g/dL (ref 3.5–5.0)
Alkaline Phosphatase: 81 U/L (ref 38–126)
BILIRUBIN DIRECT: 0.3 mg/dL — AB (ref 0.0–0.2)
BILIRUBIN INDIRECT: 0.5 mg/dL (ref 0.3–0.9)
Total Bilirubin: 0.8 mg/dL (ref 0.3–1.2)
Total Protein: 7.3 g/dL (ref 6.5–8.1)

## 2018-04-25 LAB — CBC
HCT: 40.7 % (ref 36.0–46.0)
Hemoglobin: 13.2 g/dL (ref 12.0–15.0)
MCH: 30 pg (ref 26.0–34.0)
MCHC: 32.4 g/dL (ref 30.0–36.0)
MCV: 92.5 fL (ref 80.0–100.0)
PLATELETS: 217 10*3/uL (ref 150–400)
RBC: 4.4 MIL/uL (ref 3.87–5.11)
RDW: 13.3 % (ref 11.5–15.5)
WBC: 7.1 10*3/uL (ref 4.0–10.5)
nRBC: 0 % (ref 0.0–0.2)

## 2018-04-25 LAB — LIPASE, BLOOD: LIPASE: 29 U/L (ref 11–51)

## 2018-04-25 LAB — TROPONIN I

## 2018-04-25 MED ORDER — FAMOTIDINE IN NACL 20-0.9 MG/50ML-% IV SOLN
20.0000 mg | Freq: Once | INTRAVENOUS | Status: AC
  Administered 2018-04-26: 20 mg via INTRAVENOUS
  Filled 2018-04-25: qty 50

## 2018-04-25 MED ORDER — KETOROLAC TROMETHAMINE 30 MG/ML IJ SOLN
15.0000 mg | Freq: Once | INTRAMUSCULAR | Status: AC
  Administered 2018-04-26: 15 mg via INTRAVENOUS
  Filled 2018-04-25: qty 1

## 2018-04-25 MED ORDER — ONDANSETRON HCL 4 MG/2ML IJ SOLN
4.0000 mg | Freq: Once | INTRAMUSCULAR | Status: AC
  Administered 2018-04-26: 4 mg via INTRAVENOUS
  Filled 2018-04-25: qty 2

## 2018-04-25 NOTE — ED Notes (Signed)
Attempted IV access x 1, able to get blood work, however, unable to thread IV to maintain patency.  Blood sent to the lab.

## 2018-04-25 NOTE — ED Notes (Signed)
Patient transported to X-ray 

## 2018-04-25 NOTE — ED Triage Notes (Addendum)
Pt arrived via EMS from home with reports of chest pain that started after she woke up about 30 mins ago. Pt states she woke up and went to the bathroom and when she laid back down she started having chest pain with nausea.  The pain radiates to the right shoulder and back.  Pt also reports shortness of breath with the chest pain.   Pt denies any recent illness, no cough or cold sxs.   Pt given 324 ASA with EMS and 1 spray NTG. Per pt NTG did not help with pain.

## 2018-04-25 NOTE — ED Provider Notes (Signed)
[facility] Emergency Department Provider Note   ____________________________________________   First MD Initiated Contact with Patient 04/25/18 2307     (approximate)  I have reviewed the triage vital signs and the nursing notes.   HISTORY  Chief Complaint Chest Pain    HPI Samantha Wells is a 52 y.o. female who presents to the ED from home via EMS with a chief complaint of chest pain.  Patient works third shift so she has been sleeping since this morning.  Awoke around 8:30 PM with midsternal, sharp, nonradiating chest pain.  Symptoms associated with nausea/vomiting and shortness of breath.  Has not had similar symptoms previously.  Had heart cath in 2017 which was normal per patient.  Denies recent fever, chills, abdominal pain, dysuria, diarrhea.  Denies recent travel, trauma or hormone use.   Past Medical History:  Diagnosis Date  . Hypertension     Patient Active Problem List   Diagnosis Date Noted  . Chest pain, rule out acute myocardial infarction 01/16/2016  . Chest pain 01/16/2016  . Musculoskeletal chest pain 01/09/2016  . Abdominal pain, epigastric 01/09/2016  . Essential hypertension, benign 01/09/2016  . Hypokalemia 01/09/2016  . Hyperglycemia 01/09/2016    Past Surgical History:  Procedure Laterality Date  . CARDIAC CATHETERIZATION N/A 01/16/2016   Procedure: Right/Left Heart Cath and Coronary Angiography;  Surgeon: Laurier Nancy, MD;  Location: [facility] INVASIVE CV LAB;  Service: Cardiovascular;  Laterality: N/A;  . CESAREAN SECTION    . Left Shoulder Repair Left   . TUBAL LIGATION      Prior to Admission medications   Medication Sig Start Date End Date Taking? Authorizing Provider  aspirin 81 MG tablet Take 81 mg by mouth daily.    [provider]  calcium carbonate (OS-CAL - DOSED IN MG OF ELEMENTAL CALCIUM) 1250 (500 Ca) MG tablet Take 1 tablet by mouth daily with breakfast.    [provider]    HYDROcodone-acetaminophen (NORCO) 5-325 MG tablet Take 1 tablet by mouth every 6 (six) hours as needed for moderate pain. 04/26/18   Irean Hong, MD  ibuprofen (ADVIL,MOTRIN) 800 MG tablet Take 1 tablet (800 mg total) by mouth every 8 (eight) hours as needed for moderate pain. 04/26/18   Irean Hong, MD  ranitidine (ZANTAC) 75 MG tablet Take 75 mg by mouth at bedtime.    [provider]    Allergies Patient has no known allergies.  Family History  Problem Relation Age of Onset  . CAD Mother   . Hypertension Sister     Social History Social History   Tobacco Use  . Smoking status: Former Smoker    Types: Cigarettes    Last attempt to quit: 02/15/2017    Years since quitting: 1.1  . Smokeless tobacco: Never Used  Substance Use Topics  . Alcohol use: No  . Drug use: No    Review of Systems  Constitutional: No fever/chills Eyes: No visual changes. ENT: No sore throat. Cardiovascular: Positive for chest pain. Respiratory: Positive for shortness of breath. Gastrointestinal: No abdominal pain.  Positive for nausea and vomiting.  No diarrhea.  No constipation. Genitourinary: Negative for dysuria. Musculoskeletal: Negative for back pain. Skin: Negative for rash. Neurological: Negative for headaches, focal weakness or numbness.   ____________________________________________   PHYSICAL EXAM:  VITAL SIGNS: ED Triage Vitals  Enc Vitals Group     BP 04/25/18 2230 (!) 160/95     Pulse Rate 04/25/18 2230 (!) 103  Resp 04/25/18 2230 18     Temp 04/25/18 2230 98.1 F (36.7 C)     Temp Source 04/25/18 2230 Oral     SpO2 04/25/18 2230 100 %     Weight 04/25/18 2229 150 lb (68 kg)     Height 04/25/18 2229 5\' 4"  (1.626 m)     Head Circumference --      Peak Flow --      Pain Score 04/25/18 2229 10     Pain Loc --      Pain Edu? --      Excl. in GC? --     Constitutional: Alert and oriented. Well appearing and in no acute distress. Eyes: Conjunctivae are  normal. PERRL. EOMI. Head: Atraumatic. Nose: No congestion/rhinnorhea. Mouth/Throat: Mucous membranes are moist.  Oropharynx non-erythematous. Neck: No stridor.   Cardiovascular: Normal rate, regular rhythm. Grossly normal heart sounds.  Good peripheral circulation. Respiratory: Normal respiratory effort.  No retractions. Lungs CTAB.  Anterior chest tender to palpation. Gastrointestinal: Soft and nontender. No distention. No abdominal bruits. No CVA tenderness. Musculoskeletal: No lower extremity tenderness nor edema.  No joint effusions. Neurologic:  Normal speech and language. No gross focal neurologic deficits are appreciated. No gait instability. Skin:  Skin is warm, dry and intact. No rash noted. Psychiatric: Mood and affect are normal. Speech and behavior are normal.  ____________________________________________   LABS (all labs ordered are listed, but only abnormal results are displayed)  Labs Reviewed  HEPATIC FUNCTION PANEL - Abnormal; Notable for the following components:      Result Value   Bilirubin, Direct 0.3 (*)    All other components within normal limits  BASIC METABOLIC PANEL  CBC  TROPONIN I  LIPASE, BLOOD  ETHANOL  TROPONIN I  FIBRIN DERIVATIVES D-DIMER ([facility] ONLY)   ____________________________________________  EKG  ED ECG REPORT I, Manly Nestle J, the attending physician, personally viewed and interpreted this ECG.   Date: 04/25/2018  EKG Time: 2229  Rate: 84  Rhythm: normal EKG, normal sinus rhythm  Axis: Normal  Intervals:LVH  ST&T Change: Nonspecific  ____________________________________________  RADIOLOGY  ED MD interpretation: No acute cardiopulmonary process  Official radiology report(s): Dg Chest 2 View  Result Date: 04/25/2018 CLINICAL DATA:  Chest pain EXAM: CHEST - 2 VIEW COMPARISON:  Chest x-ray dated 01/03/2017 FINDINGS: Cardiomediastinal silhouette is within normal limits in size and configuration. Lungs are clear. Lung volumes  are normal. No evidence of pneumonia. No pleural effusion. No pneumothorax seen. Osseous structures about the chest are unremarkable. IMPRESSION: No active cardiopulmonary disease. Electronically Signed   By: Bary Richard M.D.   On: 04/25/2018 23:33    ____________________________________________   PROCEDURES  Procedure(s) performed: None  Procedures  Critical Care performed: No  ____________________________________________   INITIAL IMPRESSION / ASSESSMENT AND PLAN / ED COURSE  As part of my medical decision making, I reviewed the following data within the electronic MEDICAL RECORD NUMBER Nursing notes reviewed and incorporated, Labs reviewed, EKG interpreted, Radiograph reviewed and Notes from prior ED visits   52 year old female with hypertension who presents with chest pain. Differential diagnosis includes, but is not limited to, ACS, aortic dissection, pulmonary embolism, cardiac tamponade, pneumothorax, pneumonia, pericarditis, myocarditis, GI-related causes including esophagitis/gastritis, and musculoskeletal chest wall pain.    Pain was not affected by aspirin or nitroglycerin given by EMS.  Pain is reproducible on my examination.  Will initiate 20 mg IV Pepcid for some burning elements of patient's pain, 15 mg IV Toradol for pain, 4 mg  IV Zofran for nausea.  Will check repeat troponin, d-dimer, LFTs and lipase.  Will reassess.  Clinical Course as of Apr 26 313  Wynelle LinkSun Apr 26, 2018  [id number] Patient has been sleeping after administration of IV medications.  D-dimer and repeat troponin remain negative.  She is feeling significant better.  Will discharge home on anti-inflammatory, analgesia and she will follow-up closely with her PCP.  Strict return precautions given.  Patient verbalizes understanding agrees with plan of care.   [JS]    Clinical Course User Index [JS] Irean HongSung, Beyla Loney J, MD     ____________________________________________   FINAL CLINICAL IMPRESSION(S) / ED  DIAGNOSES  Final diagnoses:  Nonspecific chest pain  Chest wall pain     ED Discharge Orders         Ordered    ibuprofen (ADVIL,MOTRIN) 800 MG tablet  Every 8 hours PRN     04/26/18 0313    HYDROcodone-acetaminophen (NORCO) 5-325 MG tablet  Every 6 hours PRN     04/26/18 0313           Note:  This document was prepared using Dragon voice recognition software and may include unintentional dictation errors.    Irean HongSung, Lukas Pelcher J, MD 04/26/18 [phone]

## 2018-04-25 NOTE — ED Notes (Signed)
Returned from XR 

## 2018-04-26 LAB — FIBRIN DERIVATIVES D-DIMER (ARMC ONLY): FIBRIN DERIVATIVES D-DIMER (ARMC): 494.39 ng{FEU}/mL (ref 0.00–499.00)

## 2018-04-26 LAB — TROPONIN I

## 2018-04-26 MED ORDER — IBUPROFEN 800 MG PO TABS: 800.0000 mg | ORAL_TABLET | Freq: Three times a day (TID) | ORAL | 0 refills | Status: DC | PRN

## 2018-04-26 MED ORDER — HYDROCODONE-ACETAMINOPHEN 5-325 MG PO TABS: 1.0000 | ORAL_TABLET | Freq: Four times a day (QID) | ORAL | 0 refills | Status: DC | PRN

## 2018-04-26 NOTE — Discharge Instructions (Addendum)
1.  You may take pain medicines as needed (Motrin/Norco #15). 2.  Apply moist heat to affected area several times daily. 3.  Return to the ER for worsening symptoms, persistent vomiting, difficulty breathing or other concerns. 

## 2018-10-12 ENCOUNTER — Emergency Department: Payer: Self-pay

## 2018-10-12 ENCOUNTER — Other Ambulatory Visit: Payer: Self-pay

## 2018-10-12 ENCOUNTER — Emergency Department
Admission: EM | Admit: 2018-10-12 | Discharge: 2018-10-12 | Disposition: A | Discharge status: Home / Self Care | Payer: Self-pay | Attending: Emergency Medicine | Admitting: Emergency Medicine

## 2018-10-12 DIAGNOSIS — M25562 Pain in left knee: Secondary | ICD-10-CM | POA: Insufficient documentation

## 2018-10-12 DIAGNOSIS — Z7982 Long term (current) use of aspirin: Secondary | ICD-10-CM | POA: Insufficient documentation

## 2018-10-12 DIAGNOSIS — I10 Essential (primary) hypertension: Secondary | ICD-10-CM | POA: Insufficient documentation

## 2018-10-12 DIAGNOSIS — M25561 Pain in right knee: Secondary | ICD-10-CM | POA: Insufficient documentation

## 2018-10-12 DIAGNOSIS — M79604 Pain in right leg: Secondary | ICD-10-CM | POA: Insufficient documentation

## 2018-10-12 DIAGNOSIS — F17210 Nicotine dependence, cigarettes, uncomplicated: Secondary | ICD-10-CM | POA: Insufficient documentation

## 2018-10-12 MED ORDER — IBUPROFEN 600 MG PO TABS
600.0000 mg | ORAL_TABLET | ORAL | Status: AC
  Administered 2018-10-12: 600 mg via ORAL
  Filled 2018-10-12: qty 1

## 2018-10-12 NOTE — Discharge Instructions (Signed)
Please follow-up closely with your physician.  Return to the Emergency Department (ED) if you experience any worsening pain, fever, sudden swelling or trouble breathing, pain/pressure/tightness, difficulty breathing, or sudden sweating, or other symptoms that concern you.
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
## 2019-03-05 ENCOUNTER — Other Ambulatory Visit: Payer: Self-pay

## 2019-03-05 DIAGNOSIS — I10 Essential (primary) hypertension: Secondary | ICD-10-CM | POA: Insufficient documentation

## 2019-03-05 DIAGNOSIS — Z79899 Other long term (current) drug therapy: Secondary | ICD-10-CM | POA: Insufficient documentation

## 2019-03-05 DIAGNOSIS — Z7982 Long term (current) use of aspirin: Secondary | ICD-10-CM | POA: Insufficient documentation

## 2019-03-05 DIAGNOSIS — F17200 Nicotine dependence, unspecified, uncomplicated: Secondary | ICD-10-CM | POA: Insufficient documentation

## 2019-03-06 ENCOUNTER — Emergency Department
Admission: EM | Admit: 2019-03-06 | Discharge: 2019-03-06 | Disposition: A | Discharge status: Home / Self Care | Payer: Self-pay | Attending: Emergency Medicine | Admitting: Emergency Medicine

## 2019-03-06 DIAGNOSIS — I10 Essential (primary) hypertension: Secondary | ICD-10-CM

## 2019-03-06 LAB — CBC WITH DIFFERENTIAL/PLATELET
Abs Immature Granulocytes: 0.04 10*3/uL (ref 0.00–0.07)
Basophils Absolute: 0 10*3/uL (ref 0.0–0.1)
Basophils Relative: 0 %
Eosinophils Absolute: 0.1 10*3/uL (ref 0.0–0.5)
Eosinophils Relative: 1 %
HCT: 38 % (ref 36.0–46.0)
Hemoglobin: 12.6 g/dL (ref 12.0–15.0)
Immature Granulocytes: 0 %
Lymphocytes Relative: 25 %
Lymphs Abs: 2.2 10*3/uL (ref 0.7–4.0)
MCH: 30.1 pg (ref 26.0–34.0)
MCHC: 33.2 g/dL (ref 30.0–36.0)
MCV: 90.9 fL (ref 80.0–100.0)
Monocytes Absolute: 0.5 10*3/uL (ref 0.1–1.0)
Monocytes Relative: 5 %
Neutro Abs: 6.2 10*3/uL (ref 1.7–7.7)
Neutrophils Relative %: 69 %
Platelets: 243 10*3/uL (ref 150–400)
RBC: 4.18 MIL/uL (ref 3.87–5.11)
RDW: 12.9 % (ref 11.5–15.5)
WBC: 9.1 10*3/uL (ref 4.0–10.5)
nRBC: 0 % (ref 0.0–0.2)

## 2019-03-06 LAB — COMPREHENSIVE METABOLIC PANEL
ALT: 14 U/L (ref 0–44)
AST: 24 U/L (ref 15–41)
Albumin: 4.2 g/dL (ref 3.5–5.0)
Alkaline Phosphatase: 103 U/L (ref 38–126)
Anion gap: 9 (ref 5–15)
BUN: 19 mg/dL (ref 6–20)
CO2: 26 mmol/L (ref 22–32)
Calcium: 9.6 mg/dL (ref 8.9–10.3)
Chloride: 104 mmol/L (ref 98–111)
Creatinine, Ser: 0.93 mg/dL (ref 0.44–1.00)
GFR calc Af Amer: >60 mL/min (ref >60)
GFR calc non Af Amer: >60 mL/min (ref >60)
Glucose, Bld: 105 mg/dL — ABNORMAL HIGH (ref 70–99)
Potassium: 3.6 mmol/L (ref 3.5–5.1)
Sodium: 139 mmol/L (ref 135–145)
Total Bilirubin: 0.5 mg/dL (ref 0.3–1.2)
Total Protein: 7.6 g/dL (ref 6.5–8.1)

## 2019-03-06 MED ORDER — AMLODIPINE BESYLATE 5 MG PO TABS: 5.0000 mg | ORAL_TABLET | Freq: Every day | ORAL | 1 refills | Status: DC

## 2019-03-06 MED ORDER — TRAMADOL HCL 50 MG PO TABS
50.0000 mg | ORAL_TABLET | Freq: Once | ORAL | Status: AC
  Administered 2019-03-06: 06:00:00 50 mg via ORAL
  Filled 2019-03-06: qty 1

## 2019-03-06 MED ORDER — AMLODIPINE BESYLATE 5 MG PO TABS
5.0000 mg | ORAL_TABLET | Freq: Once | ORAL | Status: AC
  Administered 2019-03-06: 07:00:00 5 mg via ORAL
  Filled 2019-03-06: qty 1

## 2019-03-06 NOTE — ED Triage Notes (Signed)
Patient reports symptoms began at 2:30 pm.  Patient reports checking BP all day and it was elevated.  Reports headache since this morning.

## 2019-03-06 NOTE — ED Provider Notes (Signed)
[facility] Emergency Department Provider Note ___________   First MD Initiated Contact with Patient 03/06/19 [phone]     (approximate)  I have reviewed the triage vital signs and the nursing notes.   HISTORY  Chief Complaint Hypertension and Headache   HPI Samantha Wells is a 53 y.o. female with below list of previous medical conditions including hypertension presents to the emergency department secondary to elevated blood pressure "all day yesterday patient presents a log of blood pressures ranging as high as 184/100.  Patient also admits to frontal headache.  Patient denies any weakness no numbness gait instability patient states that she was notified that she had high blood pressure in the past however was never prescribed medication.  Patient denies taking any medications for her headache        Past Medical History:  Diagnosis Date  . Hypertension     Patient Active Problem List   Diagnosis Date Noted  . Chest pain, rule out acute myocardial infarction 01/16/2016  . Chest pain 01/16/2016  . Musculoskeletal chest pain 01/09/2016  . Abdominal pain, epigastric 01/09/2016  . Essential hypertension, benign 01/09/2016  . Hypokalemia 01/09/2016  . Hyperglycemia 01/09/2016    Past Surgical History:  Procedure Laterality Date  . CARDIAC CATHETERIZATION N/A 01/16/2016   Procedure: Right/Left Heart Cath and Coronary Angiography;  Surgeon: Laurier Nancy, MD;  Location: [facility] INVASIVE CV LAB;  Service: Cardiovascular;  Laterality: N/A;  . CESAREAN SECTION    . Left Shoulder Repair Left   . TUBAL LIGATION      Prior to Admission medications   Medication Sig Start Date End Date Taking? Authorizing Provider  aspirin 81 MG tablet Take 81 mg by mouth daily.    [provider]  calcium carbonate (OS-CAL - DOSED IN MG OF ELEMENTAL CALCIUM) 1250 (500 Ca) MG tablet Take 1 tablet by mouth daily with breakfast.    [provider]   HYDROcodone-acetaminophen (NORCO) 5-325 MG tablet Take 1 tablet by mouth every 6 (six) hours as needed for moderate pain. 04/26/18   Irean Hong, MD  ibuprofen (ADVIL,MOTRIN) 800 MG tablet Take 1 tablet (800 mg total) by mouth every 8 (eight) hours as needed for moderate pain. 04/26/18   Irean Hong, MD  ranitidine (ZANTAC) 75 MG tablet Take 75 mg by mouth at bedtime.    [provider]    Allergies Patient has no known allergies.  Family History  Problem Relation Age of Onset  . CAD Mother   . Hypertension Sister     Social History Social History   Tobacco Use  . Smoking status: Current Some Day Smoker    Last attempt to quit: 02/15/2017    Years since quitting: 2.0  . Smokeless tobacco: Never Used  Substance Use Topics  . Alcohol use: No  . Drug use: No    Review of Systems Constitutional: No fever/chills Eyes: No visual changes. ENT: No sore throat. Cardiovascular: Denies chest pain. Respiratory: Denies shortness of breath. Gastrointestinal: No abdominal pain.  No nausea, no vomiting.  No diarrhea.  No constipation. Genitourinary: Negative for dysuria. Musculoskeletal: Negative for neck pain.  Negative for back pain. Integumentary: Negative for rash. Neurological: Negative for headaches, focal weakness or numbness.   ____________________________________________   PHYSICAL EXAM:  VITAL SIGNS: ED Triage Vitals  Enc Vitals Group     BP 03/06/19 0035 (!) 168/77     Pulse Rate 03/06/19 0035 77     Resp 03/06/19 0035  18     Temp 03/06/19 0035 (!) 97.4 F (36.3 C)     Temp Source 03/06/19 0035 Oral     SpO2 03/06/19 0035 100 %     Weight 03/06/19 0035 73.9 kg (163 lb)     Height 03/06/19 0035 1.626 m (5\' 4" )     Head Circumference --      Peak Flow --      Pain Score 03/06/19 0034 9     Pain Loc --      Pain Edu? --      Excl. in [place]? --    Constitutional: Alert and oriented.  Eyes: Conjunctivae are normal.  Mouth/Throat: Mucous membranes are  moist. Neck: No stridor.  No meningeal signs.   Cardiovascular: Normal rate, regular rhythm. Good peripheral circulation. Grossly normal heart sounds. Respiratory: Normal respiratory effort.  No retractions. Gastrointestinal: Soft and nontender. No distention.  Musculoskeletal: No lower extremity tenderness nor edema. No gross deformities of extremities. Neurologic:  Normal speech and language. No gross focal neurologic deficits are appreciated.  Skin:  Skin is warm, dry and intact. Psychiatric: Mood and affect are normal. Speech and behavior are normal.  ____________________________________________   LABS (all labs ordered are listed, but only abnormal results are displayed)  Labs Reviewed  COMPREHENSIVE METABOLIC PANEL - Abnormal; Notable for the following components:      Result Value   Glucose, Bld 105 (*)    All other components within normal limits  CBC WITH DIFFERENTIAL/PLATELET     Procedures   ____________________________________________   INITIAL IMPRESSION / MDM / ASSESSMENT AND PLAN / ED COURSE  As part of my medical decision making, I reviewed the following data within the electronic MEDICAL RECORD NUMBER  53 year old female presented above-stated history and physical exam secondary to hypertension and frontal headache.  Patient given Ultram 50 mg in the emergency department with resolution of headache regarding the patient's hypertension will prescribe Norvasc with recommendation to follow-up with primary care provider which she will be referred to ____________________________________________  FINAL CLINICAL IMPRESSION(S) / ED DIAGNOSES  Final diagnoses:  Essential hypertension     MEDICATIONS GIVEN DURING THIS VISIT:  Medications  traMADol (ULTRAM) tablet 50 mg (50 mg Oral Given 03/06/19 0550)     ED Discharge Orders    None      *Please note:  Samantha Wells was evaluated in Emergency Department on 03/06/2019 for the symptoms described in the history of  present illness. She was evaluated in the context of the global COVID-19 pandemic, which necessitated consideration that the patient might be at risk for infection with the SARS-CoV-2 virus that causes COVID-19. Institutional protocols and algorithms that pertain to the evaluation of patients at risk for COVID-19 are in a state of rapid change based on information released by regulatory bodies including the CDC and federal and state organizations. These policies and algorithms were followed during the patient's care in the ED.  Some ED evaluations and interventions may be delayed as a result of limited staffing during the pandemic.*  Note:  This document was prepared using Dragon voice recognition software and may include unintentional dictation errors.   Gregor Hams, MD 03/06/19 [phone]

## 2019-08-20 ENCOUNTER — Emergency Department: Payer: Self-pay

## 2019-08-20 ENCOUNTER — Other Ambulatory Visit: Payer: Self-pay

## 2019-08-20 ENCOUNTER — Emergency Department
Admission: EM | Admit: 2019-08-20 | Discharge: 2019-08-20 | Disposition: A | Discharge status: Home / Self Care | Payer: Self-pay | Attending: Emergency Medicine | Admitting: Emergency Medicine

## 2019-08-20 DIAGNOSIS — M25562 Pain in left knee: Secondary | ICD-10-CM | POA: Insufficient documentation

## 2019-08-20 DIAGNOSIS — F17210 Nicotine dependence, cigarettes, uncomplicated: Secondary | ICD-10-CM | POA: Insufficient documentation

## 2019-08-20 DIAGNOSIS — M7122 Synovial cyst of popliteal space [Baker], left knee: Secondary | ICD-10-CM | POA: Insufficient documentation

## 2019-08-20 DIAGNOSIS — Z79899 Other long term (current) drug therapy: Secondary | ICD-10-CM | POA: Insufficient documentation

## 2019-08-20 DIAGNOSIS — I10 Essential (primary) hypertension: Secondary | ICD-10-CM | POA: Insufficient documentation

## 2019-08-20 MED ORDER — AMLODIPINE BESYLATE 5 MG PO TABS: 5.0000 mg | ORAL_TABLET | Freq: Every day | ORAL | 3 refills | Status: AC

## 2019-08-20 MED ORDER — AMLODIPINE BESYLATE 5 MG PO TABS
5.0000 mg | ORAL_TABLET | Freq: Once | ORAL | Status: AC
  Administered 2019-08-20: 12:00:00 5 mg via ORAL
  Filled 2019-08-20: qty 1

## 2019-08-20 MED ORDER — MELOXICAM 15 MG PO TABS: 15.0000 mg | ORAL_TABLET | Freq: Every day | ORAL | 2 refills | Status: AC

## 2019-08-20 MED ORDER — AMLODIPINE BESYLATE 5 MG PO TABS: 10.0000 mg | ORAL_TABLET | Freq: Once | ORAL | Status: DC

## 2019-08-20 MED ORDER — CYCLOBENZAPRINE HCL 5 MG PO TABS: 5.0000 mg | ORAL_TABLET | Freq: Three times a day (TID) | ORAL | 0 refills | Status: DC | PRN

## 2019-08-20 NOTE — ED Notes (Signed)
See triage note  Presents with left knee pain  States pain has been going on for some time  Denies any injury  States her knee is swollen and  Having pain anterior and posterior  Ambulates with slight limp

## 2019-08-20 NOTE — ED Triage Notes (Signed)
Pt comes POV with left leg pain above the knee. Pt went to doctor who ordered an ultrasound on the 13th but pt states that she can't wait that long. MD told her possible baker's cyst. No recent injury.

## 2019-08-20 NOTE — ED Provider Notes (Signed)
[facility] Emergency Department Provider Note ____________________________________________  Time seen: 1020  I have reviewed the triage vital signs and the nursing notes.  HISTORY  Chief Complaint  Leg Injury  HPI Samantha Wells is a 54 y.o. female presents herself to the ED for left leg pain.  Patient describes a clinical diagnosis of a Baker's cyst to the left posterior knee.  She had an ultrasound of the left leg ordered on the April 13th, but is unable to wait any longer.  She presents with leg pain without known injury. She localizes pain the medial and posterior knee. She also reports fullness to the popliteal space with bending. She denies any catch, click, lock or give-way. She denies any preceding injury, trauma, or fall.  She also denies any swelling, skin changes, or rashes.  She is denying any chest pain, fevers, shortness of breath, or distal paresthesias.   Past Medical History:  Diagnosis Date  . Hypertension     Patient Active Problem List   Diagnosis Date Noted  . Chest pain, rule out acute myocardial infarction 01/16/2016  . Chest pain 01/16/2016  . Musculoskeletal chest pain 01/09/2016  . Abdominal pain, epigastric 01/09/2016  . Essential hypertension, benign 01/09/2016  . Hypokalemia 01/09/2016  . Hyperglycemia 01/09/2016    Past Surgical History:  Procedure Laterality Date  . CARDIAC CATHETERIZATION N/A 01/16/2016   Procedure: Right/Left Heart Cath and Coronary Angiography;  Surgeon: Laurier Nancy, MD;  Location: [facility] INVASIVE CV LAB;  Service: Cardiovascular;  Laterality: N/A;  . CESAREAN SECTION    . Left Shoulder Repair Left   . TUBAL LIGATION      Prior to Admission medications   Medication Sig Start Date End Date Taking? Authorizing Provider  amLODipine (NORVASC) 5 MG tablet Take 1 tablet (5 mg total) by mouth daily. 08/20/19 12/18/19  Jayvier Burgher, Charlesetta Ivory, PA-C  aspirin 81 MG tablet Take 81 mg by mouth daily.    [provider]  calcium carbonate (OS-CAL - DOSED IN MG OF ELEMENTAL CALCIUM) 1250 (500 Ca) MG tablet Take 1 tablet by mouth daily with breakfast.    [provider]  cyclobenzaprine (FLEXERIL) 5 MG tablet Take 1 tablet (5 mg total) by mouth 3 (three) times daily as needed. 08/20/19   Gaylin Bulthuis, Charlesetta Ivory, PA-C  meloxicam (MOBIC) 15 MG tablet Take 1 tablet (15 mg total) by mouth daily. 08/20/19 11/18/19  Rayhaan Huster, Charlesetta Ivory, PA-C  ranitidine (ZANTAC) 75 MG tablet Take 75 mg by mouth at bedtime.    [provider]    Allergies Patient has no known allergies.  Family History  Problem Relation Age of Onset  . CAD Mother   . Hypertension Sister     Social History Social History   Tobacco Use  . Smoking status: Current Some Day Smoker    Last attempt to quit: 02/15/2017    Years since quitting: 2.5  . Smokeless tobacco: Never Used  Substance Use Topics  . Alcohol use: No  . Drug use: No    Review of Systems  Constitutional: Negative for fever. Cardiovascular: Negative for chest pain. Respiratory: Negative for shortness of breath. Musculoskeletal: Negative for back pain.  Left leg pain as above. Skin: Negative for rash. Neurological: Negative for headaches, focal weakness or numbness. ____________________________________________  PHYSICAL EXAM:  VITAL SIGNS: ED Triage Vitals  Enc Vitals Group     BP 08/20/19 1008 (!) 150/103     Pulse Rate 08/20/19 1008 79  Resp 08/20/19 1008 18     Temp 08/20/19 1008 98.2 F (36.8 C)     Temp Source 08/20/19 1008 Oral     SpO2 08/20/19 1008 99 %     Weight 08/20/19 1007 192 lb (87.1 kg)     Height 08/20/19 1007 5\' 4"  (1.626 m)     Head Circumference --      Peak Flow --      Pain Score 08/20/19 1007 10     Pain Loc --      Pain Edu? --      Excl. in [place]? --     Constitutional: Alert and oriented. Well appearing and in no distress. Head: Normocephalic and atraumatic. Eyes: Conjunctivae are normal. Normal  extraocular movements Cardiovascular: Normal rate, regular rhythm. Normal distal pulses. Respiratory: Normal respiratory effort. No wheezes/rales/rhonchi. Gastrointestinal: Soft and nontender. No distention. Musculoskeletal: left knee without deformity or effusion. Mild medial joint tenderness. No posterior fullness. No valgus/varus laxity. Negative anterior/posterior drawer. No calf or achilles tenderness. Nontender with normal range of motion in all extremities.  Neurologic:  Normal gait without ataxia. Normal speech and language. No gross focal neurologic deficits are appreciated. Skin:  Skin is warm, dry and intact. No rash noted. ____________________________________________   RADIOLOGY  DG Left Knee IMPRESSION: Minimal osteophytosis about the knee consistent with mild degenerative disease. Otherwise negative.  US Venous LLE  IMPRESSION: 1. No evidence of deep venous thrombosis. 2. 4.5 cm left popliteal fossa cyst, likely Baker's cyst. ____________________________________________  PROCEDURES  Procedures ____________________________________________  INITIAL IMPRESSION / ASSESSMENT AND PLAN / ED COURSE  DDX: knee strain/sprain, MCL injury, DVT, Baker's cyst, DJD  Patient with ED evaluation of ongoing left knee and leg pain.  She is a clinical diagnosis consistent with a popliteal Baker's cyst, and bilateral LE ultrasound in 2020 did not reveal any underlying DVT process.  Patient on exam today was found to have some medial joint line tenderness but no joint effusion or indication of internal derangement.  X-ray reveals some mild degenerative joint disease but otherwise is unremarkable.  Ultrasound of the left lower extremity did reveal a 4.5 cm popliteal fossa cyst consistent with a Baker's cyst etiology.  Patient's exam is otherwise consistent with knee pain secondary to underlying DJD.  She is discharged with prescriptions for meloxicam as well as Flexeril.  She is also given  courtesy refill of her amlodipine blood pressure medicine for she is encouraged to follow-up with primary provider as planned, will consider referral to orthopedics for ongoing symptom management.  A refill is provided for 3 days as requested.  Samantha Wells was evaluated in Emergency Department on 08/20/2019 for the symptoms described in the history of present illness. She was evaluated in the context of the global COVID-19 pandemic, which necessitated consideration that the patient might be at risk for infection with the SARS-CoV-2 virus that causes COVID-19. Institutional protocols and algorithms that pertain to the evaluation of patients at risk for COVID-19 are in a state of rapid change based on information released by regulatory bodies including the CDC and federal and state organizations. These policies and algorithms were followed during the patient's care in the ED. ____________________________________________  FINAL CLINICAL IMPRESSION(S) / ED DIAGNOSES  Final diagnoses:  Acute pain of left knee  Baker's cyst of knee, left  Uncontrolled hypertension      Shaivi Rothschild, Dannielle Karvonen, PA-C 08/20/19 1446    Duffy Bruce, MD 08/20/19 1752

## 2019-08-20 NOTE — Discharge Instructions (Signed)
Your exam, XR, and Ultrasound are essentially normal. There is no evidence of a blood clot in the left leg. You do have a small Baker's cyst behind the left knee. Take the prescription meds as directed. You may take OTC Tylenol and Benadryl for additional headache pain relief. Take your blood pressure pills as directed. Follow-up with your provider for continued symptoms.

## 2019-10-16 ENCOUNTER — Emergency Department
Admission: EM | Admit: 2019-10-16 | Discharge: 2019-10-16 | Disposition: A | Discharge status: Home / Self Care | Payer: Self-pay | Attending: Emergency Medicine | Admitting: Emergency Medicine

## 2019-10-16 ENCOUNTER — Emergency Department: Payer: Self-pay

## 2019-10-16 ENCOUNTER — Encounter: Payer: Self-pay | Admitting: Emergency Medicine

## 2019-10-16 ENCOUNTER — Other Ambulatory Visit: Payer: Self-pay

## 2019-10-16 DIAGNOSIS — I10 Essential (primary) hypertension: Secondary | ICD-10-CM | POA: Insufficient documentation

## 2019-10-16 DIAGNOSIS — Z79899 Other long term (current) drug therapy: Secondary | ICD-10-CM | POA: Insufficient documentation

## 2019-10-16 DIAGNOSIS — R079 Chest pain, unspecified: Secondary | ICD-10-CM | POA: Insufficient documentation

## 2019-10-16 DIAGNOSIS — Z7982 Long term (current) use of aspirin: Secondary | ICD-10-CM | POA: Insufficient documentation

## 2019-10-16 DIAGNOSIS — F17210 Nicotine dependence, cigarettes, uncomplicated: Secondary | ICD-10-CM | POA: Insufficient documentation

## 2019-10-16 DIAGNOSIS — R519 Headache, unspecified: Secondary | ICD-10-CM | POA: Insufficient documentation

## 2019-10-16 LAB — BASIC METABOLIC PANEL
Anion gap: 7 (ref 5–15)
BUN: 18 mg/dL (ref 6–20)
CO2: 26 mmol/L (ref 22–32)
Calcium: 9.4 mg/dL (ref 8.9–10.3)
Chloride: 106 mmol/L (ref 98–111)
Creatinine, Ser: 0.87 mg/dL (ref 0.44–1.00)
GFR calc Af Amer: >60 mL/min (ref >60)
GFR calc non Af Amer: >60 mL/min (ref >60)
Glucose, Bld: 100 mg/dL — ABNORMAL HIGH (ref 70–99)
Potassium: 4.1 mmol/L (ref 3.5–5.1)
Sodium: 139 mmol/L (ref 135–145)

## 2019-10-16 LAB — HEPATIC FUNCTION PANEL
ALT: 14 U/L (ref 0–44)
AST: 21 U/L (ref 15–41)
Albumin: 4 g/dL (ref 3.5–5.0)
Alkaline Phosphatase: 101 U/L (ref 38–126)
Bilirubin, Direct: <0.1 mg/dL (ref 0.0–0.2)
Total Bilirubin: 0.6 mg/dL (ref 0.3–1.2)
Total Protein: 7.2 g/dL (ref 6.5–8.1)

## 2019-10-16 LAB — CBC
HCT: 36 % (ref 36.0–46.0)
Hemoglobin: 11.7 g/dL — ABNORMAL LOW (ref 12.0–15.0)
MCH: 30.2 pg (ref 26.0–34.0)
MCHC: 32.5 g/dL (ref 30.0–36.0)
MCV: 93 fL (ref 80.0–100.0)
Platelets: 242 10*3/uL (ref 150–400)
RBC: 3.87 MIL/uL (ref 3.87–5.11)
RDW: 13.3 % (ref 11.5–15.5)
WBC: 8.4 10*3/uL (ref 4.0–10.5)
nRBC: 0 % (ref 0.0–0.2)

## 2019-10-16 LAB — TROPONIN I (HIGH SENSITIVITY)
Troponin I (High Sensitivity): 4 ng/L (ref <18)
Troponin I (High Sensitivity): 5 ng/L (ref <18)

## 2019-10-16 LAB — LIPASE, BLOOD: Lipase: 22 U/L (ref 11–51)

## 2019-10-16 MED ORDER — PANTOPRAZOLE SODIUM 40 MG IV SOLR
40.0000 mg | Freq: Once | INTRAVENOUS | Status: AC
  Administered 2019-10-16: 40 mg via INTRAVENOUS
  Filled 2019-10-16: qty 40

## 2019-10-16 MED ORDER — NITROGLYCERIN 0.4 MG SL SUBL
0.4000 mg | SUBLINGUAL_TABLET | SUBLINGUAL | Status: DC | PRN
  Administered 2019-10-16 (×2): 0.4 mg via SUBLINGUAL
  Filled 2019-10-16: qty 1

## 2019-10-16 MED ORDER — HYDROMORPHONE HCL 1 MG/ML IJ SOLN
1.0000 mg | Freq: Once | INTRAMUSCULAR | Status: AC
  Administered 2019-10-16: 1 mg via INTRAVENOUS
  Filled 2019-10-16: qty 1

## 2019-10-16 MED ORDER — IOHEXOL 350 MG/ML SOLN
75.0000 mL | Freq: Once | INTRAVENOUS | Status: AC | PRN
  Administered 2019-10-16: 75 mL via INTRAVENOUS

## 2019-10-16 MED ORDER — ONDANSETRON HCL 4 MG/2ML IJ SOLN
4.0000 mg | Freq: Once | INTRAMUSCULAR | Status: AC
  Administered 2019-10-16: 4 mg via INTRAVENOUS
  Filled 2019-10-16: qty 2

## 2019-10-16 MED ORDER — PANTOPRAZOLE SODIUM 20 MG PO TBEC
20.0000 mg | DELAYED_RELEASE_TABLET | Freq: Every day | ORAL | 0 refills | Status: DC
Start: 2019-10-16 — End: 2020-03-06

## 2019-10-16 MED ORDER — MORPHINE SULFATE (PF) 4 MG/ML IV SOLN
4.0000 mg | Freq: Once | INTRAVENOUS | Status: AC
  Administered 2019-10-16: 4 mg via INTRAVENOUS
  Filled 2019-10-16: qty 1

## 2019-10-16 NOTE — ED Provider Notes (Signed)
[facility] Emergency Department Provider Note  ____________________________________________   First MD Initiated Contact with Patient 10/16/19 1127     (approximate)  I have reviewed the triage vital signs and the nursing notes.   HISTORY  Chief Complaint Chest Pain    HPI Samantha Wells is a 54 y.o. female with hypertension who comes in for sudden onset chest pain with some shortness of breath.  Patient was already given 324 of aspirin.  Patient states that she was working at [employer] when she had sudden onset around 1030 feeling something was stuck in her throat although she did not eat anything recently.  For like a chest pressure, severe, constant, associate with shortness of breath, nausea, diaphoresis.  Currently the pain is a 8 out of 10, nothing makes it better, nothing makes it worse.  Denies having any chest pain issues previously.  No prior stents.  Patient denies any abdominal pain.  Patient also is reporting a headache associated with the chest pain.          Past Medical History:  Diagnosis Date  . Hypertension     Patient Active Problem List   Diagnosis Date Noted  . Chest pain, rule out acute myocardial infarction 01/16/2016  . Chest pain 01/16/2016  . Musculoskeletal chest pain 01/09/2016  . Abdominal pain, epigastric 01/09/2016  . Essential hypertension, benign 01/09/2016  . Hypokalemia 01/09/2016  . Hyperglycemia 01/09/2016    Past Surgical History:  Procedure Laterality Date  . CARDIAC CATHETERIZATION N/A 01/16/2016   Procedure: Right/Left Heart Cath and Coronary Angiography;  Surgeon: Laurier Nancy, MD;  Location: [facility] INVASIVE CV LAB;  Service: Cardiovascular;  Laterality: N/A;  . CESAREAN SECTION    . Left Shoulder Repair Left   . TUBAL LIGATION      Prior to Admission medications   Medication Sig Start Date End Date Taking? Authorizing Provider  amLODipine (NORVASC) 5 MG tablet Take 1 tablet (5 mg total) by mouth  daily. 08/20/19 12/18/19  Menshew, Charlesetta Ivory, PA-C  aspirin 81 MG tablet Take 81 mg by mouth daily.    [provider]  calcium carbonate (OS-CAL - DOSED IN MG OF ELEMENTAL CALCIUM) 1250 (500 Ca) MG tablet Take 1 tablet by mouth daily with breakfast.    [provider]  cyclobenzaprine (FLEXERIL) 5 MG tablet Take 1 tablet (5 mg total) by mouth 3 (three) times daily as needed. 08/20/19   Menshew, Charlesetta Ivory, PA-C  meloxicam (MOBIC) 15 MG tablet Take 1 tablet (15 mg total) by mouth daily. 08/20/19 11/18/19  Menshew, Charlesetta Ivory, PA-C  ranitidine (ZANTAC) 75 MG tablet Take 75 mg by mouth at bedtime.    [provider]    Allergies Patient has no known allergies.  Family History  Problem Relation Age of Onset  . CAD Mother   . Hypertension Sister     Social History Social History   Tobacco Use  . Smoking status: Current Some Day Smoker    Last attempt to quit: 02/15/2017    Years since quitting: 2.6  . Smokeless tobacco: Never Used  Substance Use Topics  . Alcohol use: No  . Drug use: No      Review of Systems Constitutional: Positive diaphoresis Eyes: No visual changes. ENT: No sore throat. Cardiovascular: Positive chest pain Respiratory: Positive shortness of breath Gastrointestinal: No abdominal pain.  Positive nausea, no vomiting.  No diarrhea.  No constipation. Genitourinary: Negative for dysuria. Musculoskeletal: Negative for back pain.  Skin: Negative for rash. Neurological: Positive for headache, focal weakness or numbness. All other ROS negative ____________________________________________   PHYSICAL EXAM:  VITAL SIGNS: ED Triage Vitals  Enc Vitals Group     BP 10/16/19 1106 (!) 142/79     Pulse Rate 10/16/19 1106 74     Resp 10/16/19 1106 18     Temp 10/16/19 1106 98 F (36.7 C)     Temp Source 10/16/19 1106 Oral     SpO2 10/16/19 1106 100 %     Weight 10/16/19 1107 163 lb (73.9 kg)     Height 10/16/19 1107 5\' 4"  (1.626  m)     Head Circumference --      Peak Flow --      Pain Score 10/16/19 1107 9     Pain Loc --      Pain Edu? --      Excl. in GC? --     Constitutional: Alert and oriented.  Eyes: Conjunctivae are normal. EOMI. Head: Atraumatic. Nose: No congestion/rhinnorhea. Mouth/Throat: Mucous membranes are moist.   Neck: No stridor. Trachea Midline. FROM Cardiovascular: Normal rate, regular rhythm. Grossly normal heart sounds.  Good peripheral circulation. Respiratory: Normal respiratory effort.  No retractions. Lungs CTAB. Gastrointestinal: Soft and nontender. No distention. No abdominal bruits.  Musculoskeletal: No lower extremity tenderness nor edema.  No joint effusions. Neurologic:  Normal speech and language. No gross focal neurologic deficits are appreciated.  Skin:  Skin is warm, dry and intact. No rash noted. Psychiatric: Mood and affect are normal. Speech and behavior are normal. GU: Deferred   ____________________________________________   LABS (all labs ordered are listed, but only abnormal results are displayed)  Labs Reviewed  BASIC METABOLIC PANEL - Abnormal; Notable for the following components:      Result Value   Glucose, Bld 100 (*)    All other components within normal limits  CBC - Abnormal; Notable for the following components:   Hemoglobin 11.7 (*)    All other components within normal limits  HEPATIC FUNCTION PANEL  LIPASE, BLOOD  TROPONIN I (HIGH SENSITIVITY)  TROPONIN I (HIGH SENSITIVITY)   ____________________________________________   ED ECG REPORT I, 10/18/19, the attending physician, personally viewed and interpreted this ECG.  Normal sinus rate of 78, no ST elevations, no T wave inversions except for aVL, normal intervals although QTC was 499 slightly prolonged ____________________________________________  RADIOLOGY Concha Se, personally viewed and evaluated these images (plain radiographs) as part of my medical decision making, as  well as reviewing the written report by the radiologist.  ED MD interpretation: Normal x-ray  Official radiology report(s): DG Chest 2 View  Result Date: 10/16/2019 CLINICAL DATA:  New onset shortness of breath since yesterday. EXAM: CHEST - 2 VIEW COMPARISON:  04/25/2018 FINDINGS: Normal heart, mediastinum and hila. Clear lungs.  No pleural effusion or pneumothorax. Skeletal structures are intact. IMPRESSION: No active cardiopulmonary disease. Electronically Signed   By: 04/27/2018 M.D.   On: 10/16/2019 12:15    ____________________________________________   PROCEDURES  Procedure(s) performed (including Critical Care):  .1-3 Lead EKG Interpretation Performed by: 10/18/2019, MD Authorized by: Concha Se, MD     Interpretation: normal     ECG rate:  70s   ECG rate assessment: normal     Rhythm: sinus rhythm     Ectopy: none       ____________________________________________   INITIAL IMPRESSION / ASSESSMENT AND PLAN / ED COURSE   Concha Se  was evaluated in Emergency Department on 10/16/2019 for the symptoms described in the history of present illness. She was evaluated in the context of the global COVID-19 pandemic, which necessitated consideration that the patient might be at risk for infection with the SARS-CoV-2 virus that causes COVID-19. Institutional protocols and algorithms that pertain to the evaluation of patients at risk for COVID-19 are in a state of rapid change based on information released by regulatory bodies including the CDC and federal and state organizations. These policies and algorithms were followed during the patient's care in the ED.    Most Likely DDx:  - ACS given risk factors/age consider PE given shortness of breath we will give patient some IV morphine, IV Zofran, nitro and keep on the cardiac monitor to evaluate for arrhythmia    DDx that was also considered d/t potential to cause harm, but was found less likely based on history and  physical (as detailed above): -PNA (no fevers, cough but CXR to evaluate) -PNX (reassured with equal b/l breath sounds, CXR to evaluate) -Symptomatic anemia (will get H&H) -Aortic Dissection as no tearing pain and no radiation to the mid back, pulses equal -Pericarditis no rub on exam, EKG changes or hx to suggest dx -Tamponade (no notable SOB, tachycardic, hypotensive) -Esophageal rupture (no h/o diffuse vomitting/no crepitus)  12:49 PM Patient is initial labs show troponin of 4.  Patient will need second troponin given symptom onset time.  Patient stated her pain is still 8 out of 10.  Will give patient a dose of Dilaudid, Protonix in case this could related to acid reflux.  Patient continued to have shortness of breath was worse with ambulating around the room.  Discussed with patient we will proceed with CT scan to make sure no evidence of PE given continued pain and shortness of breath.  Patient also reporting a headache in case we needed to anticoagulate her with CT head to make sure no evidence of intracranial hemorrhage.  Cardiac markers are negative x2  CT imaging without evidence of PE.  Patient does have a small sliding hiatal hernia.  3:28 PM repeat evaluation patient symptom-free at this time.  Discussed with patient admission versus going home.  Patient feels good with going home given negative cardiac markers negative CT scan and symptoms have since resolved.  Patient did have a negative catheterization a few years ago which is reassuring.  Discussed with patient that her story was concerning though she needs to follow-up with cardiology if she has return of her symptoms she needs to return to the ER for further work-up at that time.  However given her symptom resolution and cardiac markers are negative I think it be reasonable for her to follow-up outpatient.  I discussed the provisional nature of ED diagnosis, the treatment so far, the ongoing plan of care, follow up appointments  and return precautions with the patient and any family or support people present. They expressed understanding and agreed with the plan, discharged home.    ____________________________________________   FINAL CLINICAL IMPRESSION(S) / ED DIAGNOSES   Final diagnoses:  Chest pain, unspecified type     MEDICATIONS GIVEN DURING THIS VISIT:  Medications  nitroGLYCERIN (NITROSTAT) SL tablet 0.4 mg (0.4 mg Sublingual Given 10/16/19 1325)  morphine 4 MG/ML injection 4 mg (4 mg Intravenous Given 10/16/19 1203)  ondansetron (ZOFRAN) injection 4 mg (4 mg Intravenous Given 10/16/19 1203)  pantoprazole (PROTONIX) injection 40 mg (40 mg Intravenous Given 10/16/19 1325)  HYDROmorphone (DILAUDID) injection 1 mg (  1 mg Intravenous Given 10/16/19 1325)  iohexol (OMNIPAQUE) 350 MG/ML injection 75 mL (75 mLs Intravenous Contrast Given 10/16/19 1400)     ED Discharge Orders         Ordered    pantoprazole (PROTONIX) 20 MG tablet  Daily     10/16/19 1531           Note:  This document was prepared using Dragon voice recognition software and may include unintentional dictation errors.   Concha Se, MD 10/16/19 [phone]

## 2019-10-16 NOTE — ED Triage Notes (Addendum)
Patient to ER via [facility] from work for c/o sudden onset of  midsternal chest pain with +shortness of breath and nausea. States she had episode of diaphoresis at onset of pain as well. Vitals per EMS: 100% O2 on RA, 78 HR (NSR), 130 systolic. 324 mg ASA given.

## 2019-10-16 NOTE — Discharge Instructions (Addendum)
No signs of a heart attack.  CT scan negative for PE.  You should follow up with cardiology.  Please call them to make an appointment.  Return the ER if you develop recurrent symptoms or any other concerns.  You start taking an acid reducer in case this hiatal hernia is causing acid reflux.  IMPRESSION:  1. No evidence of a pulmonary embolism.  2. No acute findings mild dependent lung opacities bilaterally is  felt to be due to atelectasis. No convincing pneumonia or pulmonary  edema.  3. Mild cardiomegaly.  4. Small sliding hiatal hernia with mild distension of the lower  esophagus.

## 2019-10-21 ENCOUNTER — Emergency Department
Admission: EM | Admit: 2019-10-21 | Discharge: 2019-10-22 | Disposition: A | Discharge status: Home / Self Care | Payer: Self-pay | Attending: Emergency Medicine | Admitting: Emergency Medicine

## 2019-10-21 ENCOUNTER — Emergency Department: Payer: Self-pay

## 2019-10-21 ENCOUNTER — Other Ambulatory Visit: Payer: Self-pay

## 2019-10-21 DIAGNOSIS — F17210 Nicotine dependence, cigarettes, uncomplicated: Secondary | ICD-10-CM | POA: Insufficient documentation

## 2019-10-21 DIAGNOSIS — Z79899 Other long term (current) drug therapy: Secondary | ICD-10-CM | POA: Insufficient documentation

## 2019-10-21 DIAGNOSIS — R0600 Dyspnea, unspecified: Secondary | ICD-10-CM | POA: Insufficient documentation

## 2019-10-21 DIAGNOSIS — R1011 Right upper quadrant pain: Secondary | ICD-10-CM | POA: Insufficient documentation

## 2019-10-21 DIAGNOSIS — R079 Chest pain, unspecified: Secondary | ICD-10-CM | POA: Insufficient documentation

## 2019-10-21 DIAGNOSIS — I10 Essential (primary) hypertension: Secondary | ICD-10-CM | POA: Insufficient documentation

## 2019-10-21 DIAGNOSIS — Z7982 Long term (current) use of aspirin: Secondary | ICD-10-CM | POA: Insufficient documentation

## 2019-10-21 LAB — CBC
HCT: 36.4 % (ref 36.0–46.0)
Hemoglobin: 11 g/dL — ABNORMAL LOW (ref 12.0–15.0)
MCH: 30.2 pg (ref 26.0–34.0)
MCHC: 30.2 g/dL (ref 30.0–36.0)
MCV: 100 fL (ref 80.0–100.0)
Platelets: 219 10*3/uL (ref 150–400)
RBC: 3.64 MIL/uL — ABNORMAL LOW (ref 3.87–5.11)
RDW: 13.2 % (ref 11.5–15.5)
WBC: 15.5 10*3/uL — ABNORMAL HIGH (ref 4.0–10.5)
nRBC: 0 % (ref 0.0–0.2)

## 2019-10-21 LAB — TROPONIN I (HIGH SENSITIVITY)
Troponin I (High Sensitivity): <2 ng/L (ref <18)
Troponin I (High Sensitivity): 3 ng/L (ref <18)

## 2019-10-21 LAB — BASIC METABOLIC PANEL
Anion gap: 12 (ref 5–15)
BUN: 43 mg/dL — ABNORMAL HIGH (ref 6–20)
CO2: 18 mmol/L — ABNORMAL LOW (ref 22–32)
Calcium: 9 mg/dL (ref 8.9–10.3)
Chloride: 109 mmol/L (ref 98–111)
Creatinine, Ser: 1.02 mg/dL — ABNORMAL HIGH (ref 0.44–1.00)
GFR calc Af Amer: >60 mL/min (ref >60)
GFR calc non Af Amer: >60 mL/min (ref >60)
Glucose, Bld: 102 mg/dL — ABNORMAL HIGH (ref 70–99)
Potassium: 4.1 mmol/L (ref 3.5–5.1)
Sodium: 139 mmol/L (ref 135–145)

## 2019-10-21 MED ORDER — IOHEXOL 300 MG/ML  SOLN
100.0000 mL | Freq: Once | INTRAMUSCULAR | Status: AC | PRN
  Administered 2019-10-22: 100 mL via INTRAVENOUS
  Filled 2019-10-21: qty 100

## 2019-10-21 MED ORDER — SODIUM CHLORIDE 0.9% FLUSH: 3.0000 mL | Freq: Once | INTRAVENOUS | Status: DC

## 2019-10-21 NOTE — ED Triage Notes (Addendum)
Pt comes via [facility] from home with c/o CP. Pt states mid sternal CP that radiates to left arm.  Pt states some vomiting. Pt also states SOB.

## 2019-10-21 NOTE — ED Notes (Signed)
First nurse note: Pt to the er via ems for dizziness, lightheaded, generalized weakness, chest pain. Dx here 2 days ago for hernia, 22 left hand, 324 mg ASA, 12 lead unremarkable.

## 2019-10-21 NOTE — ED Notes (Signed)
Pt reports cp today that was pressure like. Pt states she has been having these episodes intermittently for several weeks. Pt reports som SOB when the pain comes.

## 2019-10-21 NOTE — ED Provider Notes (Signed)
[facility] Emergency Department Provider Note  ____________________________________________   First MD Initiated Contact with Patient 10/21/19 2333     (approximate)  I have reviewed the triage vital signs and the nursing notes.   HISTORY  Chief Complaint Chest Pain    HPI Samantha Wells is a 54 y.o. female with below list of previous medical conditions presents to the emergency department secondary to central chest tightness that radiates to her left arm with associated dyspnea that the patient states has been occurring intermittently for the past 3 days.  Patient denies any lower extremity pain or swelling.  Patient denies any personal history of CAD or PE.  However patient does admit to family history mother died of an MI at the age of 10.  Chart review revealed that the patient was seen on 10/16/2019 secondary to chest pain with a negative CT scan of the chest and cardiac enzymes performed at that time.  Patient that she has a cardiology appointment scheduled for the 27th.        Past Medical History:  Diagnosis Date  . Hypertension     Patient Active Problem List   Diagnosis Date Noted  . Chest pain, rule out acute myocardial infarction 01/16/2016  . Chest pain 01/16/2016  . Musculoskeletal chest pain 01/09/2016  . Abdominal pain, epigastric 01/09/2016  . Essential hypertension, benign 01/09/2016  . Hypokalemia 01/09/2016  . Hyperglycemia 01/09/2016    Past Surgical History:  Procedure Laterality Date  . CARDIAC CATHETERIZATION N/A 01/16/2016   Procedure: Right/Left Heart Cath and Coronary Angiography;  Surgeon: Laurier Nancy, MD;  Location: [facility] INVASIVE CV LAB;  Service: Cardiovascular;  Laterality: N/A;  . CESAREAN SECTION    . Left Shoulder Repair Left   . TUBAL LIGATION      Prior to Admission medications   Medication Sig Start Date End Date Taking? Authorizing Provider  amLODipine (NORVASC) 5 MG tablet Take 1 tablet (5 mg total)  by mouth daily. 08/20/19 12/18/19  Menshew, Charlesetta Ivory, PA-C  aspirin 81 MG tablet Take 81 mg by mouth daily.    [provider]  calcium carbonate (OS-CAL - DOSED IN MG OF ELEMENTAL CALCIUM) 1250 (500 Ca) MG tablet Take 1 tablet by mouth daily with breakfast.    [provider]  cyclobenzaprine (FLEXERIL) 5 MG tablet Take 1 tablet (5 mg total) by mouth 3 (three) times daily as needed. 08/20/19   Menshew, Charlesetta Ivory, PA-C  meloxicam (MOBIC) 15 MG tablet Take 1 tablet (15 mg total) by mouth daily. 08/20/19 11/18/19  Menshew, Charlesetta Ivory, PA-C  pantoprazole (PROTONIX) 20 MG tablet Take 1 tablet (20 mg total) by mouth daily. 10/16/19 11/15/19  Concha Se, MD  ranitidine (ZANTAC) 75 MG tablet Take 75 mg by mouth at bedtime.    [provider]    Allergies Patient has no known allergies.  Family History  Problem Relation Age of Onset  . CAD Mother   . Hypertension Sister     Social History Social History   Tobacco Use  . Smoking status: Current Some Day Smoker    Last attempt to quit: 02/15/2017    Years since quitting: 2.6  . Smokeless tobacco: Never Used  Substance Use Topics  . Alcohol use: No  . Drug use: No    Review of Systems Constitutional: No fever/chills Eyes: No visual changes. ENT: No sore throat. Cardiovascular: Positive for chest pain. Respiratory: Positive for shortness of breath. Gastrointestinal: Positive  for abdominal pain.  No nausea, no vomiting.  No diarrhea.  No constipation. Genitourinary: Negative for dysuria. Musculoskeletal: Negative for neck pain.  Negative for back pain. Integumentary: Negative for rash. Neurological: Negative for headaches, focal weakness or numbness. _________________   PHYSICAL EXAM:  VITAL SIGNS: ED Triage Vitals  Enc Vitals Group     BP 10/21/19 1630 114/63     Pulse Rate 10/21/19 1630 70     Resp 10/21/19 1630 18     Temp 10/21/19 1630 98.8 F (37.1 C)     Temp Source 10/21/19 2222  Oral     SpO2 10/21/19 1630 100 %     Weight 10/21/19 1629 73.9 kg (163 lb)     Height 10/21/19 1629 1.626 m (5\' 4" )     Head Circumference --      Peak Flow --      Pain Score 10/21/19 1629 6     Pain Loc --      Pain Edu? --      Excl. in GC? --     Constitutional: Alert and oriented.  Eyes: Conjunctivae are normal.  Mouth/Throat: Patient is wearing a mask. Neck: No stridor.  No meningeal signs.   Cardiovascular: Normal rate, regular rhythm. Good peripheral circulation. Grossly normal heart sounds. Respiratory: Normal respiratory effort.  No retractions. Gastrointestinal: Generalized tenderness to palpation no distention.  Musculoskeletal: No lower extremity tenderness nor edema. No gross deformities of extremities. Neurologic:  Normal speech and language. No gross focal neurologic deficits are appreciated.  Skin:  Skin is warm, dry and intact. Psychiatric: Mood and affect are normal. Speech and behavior are normal.  ____________________________________________   LABS (all labs ordered are listed, but only abnormal results are displayed)  Labs Reviewed  BASIC METABOLIC PANEL - Abnormal; Notable for the following components:      Result Value   CO2 18 (*)    Glucose, Bld 102 (*)    BUN 43 (*)    Creatinine, Ser 1.02 (*)    All other components within normal limits  CBC - Abnormal; Notable for the following components:   WBC 15.5 (*)    RBC 3.64 (*)    Hemoglobin 11.0 (*)    All other components within normal limits  TROPONIN I (HIGH SENSITIVITY)  TROPONIN I (HIGH SENSITIVITY)   ____________________________________________  EKG   ED ECG REPORT I, [place] N Mylisa Brunson, the attending physician, personally viewed and interpreted this ECG.   Date: 10/21/2019  EKG Time: 4:34 PM  Rate: 90  Rhythm: Normal sinus rhythm  Axis: Normal  Intervals: Normal  ST&T Change: None _____________________________________  RADIOLOGY I, [place] N Tagg Eustice, personally viewed and  evaluated these images (plain radiographs) as part of my medical decision making, as well as reviewing the written report by the radiologist.  ED MD interpretation: Persistent low lung volumes with mild bibasilar atelectasis noted on chest x-ray per radiologist.  Official radiology report(s): DG Chest 2 View  Result Date: 10/21/2019 CLINICAL DATA:  Chest pain. Generalized weakness. Dizziness. EXAM: CHEST - 2 VIEW COMPARISON:  Radiograph and chest CTA 5 days ago 10/16/2019 FINDINGS: Persistent low lung volumes.The cardiomediastinal contours are unchanged. Minor bibasilar atelectasis. Pulmonary vasculature is normal. No consolidation, pleural effusion, or pneumothorax. No acute osseous abnormalities are seen. IMPRESSION: Persistent low lung volumes with mild bibasilar atelectasis. Electronically Signed   By: 10/18/2019 M.D.   On: 10/21/2019 17:15     Procedures   ____________________________________________   INITIAL IMPRESSION / MDM /  ASSESSMENT AND PLAN / ED COURSE  As part of my medical decision making, I reviewed the following data within the electronic MEDICAL RECORD NUMBER  54 year old female presented with above-stated history and physical exam with differential diagnosis including but not limited to ACS, angina, PE, GERD secondary to known hiatal hernia discovered on 5/15.  EKG revealed no evidence of ischemia or infarction.  Laboratory data including high-sensitivity troponin x2 -.  Given the fact that the patient was seen for similar complaint on 10/16/2019 with a negative CT angiogram of the chest performed this was not repeated today.  Given pain with palpation of the abdomen CT abdomen pelvis was performed which revealed no acute intra-abdominal pathology.  Given absence of diarrhea concern for colitis on CT not supported.  Spoke with the patient at length regarding the necessity of following up with cardiology.  ____________________________________________  FINAL CLINICAL  IMPRESSION(S) / ED DIAGNOSES  Final diagnoses:  RUQ pain  Chest pain, unspecified type     MEDICATIONS GIVEN DURING THIS VISIT:  Medications - No data to display   ED Discharge Orders    None      *Please note:  Samantha Wells was evaluated in Emergency Department on 10/21/2019 for the symptoms described in the history of present illness. She was evaluated in the context of the global COVID-19 pandemic, which necessitated consideration that the patient might be at risk for infection with the SARS-CoV-2 virus that causes COVID-19. Institutional protocols and algorithms that pertain to the evaluation of patients at risk for COVID-19 are in a state of rapid change based on information released by regulatory bodies including the CDC and federal and state organizations. These policies and algorithms were followed during the patient's care in the ED.  Some ED evaluations and interventions may be delayed as a result of limited staffing during the pandemic.*  Note:  This document was prepared using Dragon voice recognition software and may include unintentional dictation errors.   Gregor Hams, MD 10/22/19 [phone]

## 2019-10-22 NOTE — ED Notes (Signed)
Pt verbalizes understanding of d/c instructions and follow up. 

## 2019-10-25 ENCOUNTER — Ambulatory Visit: Payer: Self-pay | Admitting: Physician Assistant

## 2019-10-25 NOTE — Progress Notes (Deleted)
Office Visit    Patient Name: Samantha Wells Date of Encounter: 10/25/2019  Primary Care Provider:  Inc, [place] Primary Cardiologist: New to [facility]  Chief Complaint    No chief complaint on file.   54 yo female with history of HTN, CP with DOE, hiatal hernia with mild distention of the lower esophagus by 10/2019 CT, and no known personal history of heart dz with family history of cardiac death at age 4 (mother), and seen today for [facility] ED follow-up after presenting to the ED 10/21/19 with CP.  Past Medical History    Past Medical History:  Diagnosis Date  . Hypertension    Past Surgical History:  Procedure Laterality Date  . CARDIAC CATHETERIZATION N/A 01/16/2016   Procedure: Right/Left Heart Cath and Coronary Angiography;  Surgeon: Dionisio David, MD;  Location: [place] CV LAB;  Service: Cardiovascular;  Laterality: N/A;  . CESAREAN SECTION    . Left Shoulder Repair Left   . TUBAL LIGATION      Allergies  No Known Allergies  History of Present Illness    Samantha Wells is a 54 y.o. female with PMH as above. She has a family history of heart dz, including a mother that died of a MI at the age of 80 yo. She is new to [facility] and previously seen in the hospital by Dr. Humphrey Rolls in 2017.   In 2016, she underwent stress test at [facility] (copied below) that was without ischemia.   She was seen in the hospital by Dr. Humphrey Rolls in 2017 for CP. It was noted at that time that she had been recently seen in the hospital and ruled out for ACS. Stress test was noted to be performed in Dr. Laurelyn Sickle office (not available per EMR) with documented moderate to large anterior wall reversible defect with nl LVSF. Concern was noted for ischemia in the LAD territory. She underwent echo in the office (not available per EMR) with notes indicating concern for ASD and vegetation in the RA. She was admitted the day after her echo with CP and SOB that had occured later that  evening.   While admitted 01/2016, she underwent 01/2016 R/LHC by Dr. Humphrey Rolls as copied and pasted below that showed nl cors, LVSF estimated % with mild MR. Heart pressures as below showed PCWP 37mHg and LVEDP 360mg.. Marland Kitcheno ASD or VSD. TEE without endocarditis or vegetation. EKG showed LVH. 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 Recommendation was that CP was likely noncardiac.   10/16/2019 CT showed mild cardiomegaly and mid dependent lung opacities felt due to atelectasis, as well as a small sliding hiatal hernia with mild distention of the lower esophagus.  On 10/21/19, she presented to [facility] with central chest tightness that radiated to her L arm and was associated with DOE and occurring intermittently x3 days. EKG showed LVH but was without acute ST/T changes and HS Tn negative x2. It was noted she had been seen earlier in the month also for CP and with a negative CTA of the chest performed; therefore, this was not repeated in the ED. She was TTP of the abdomen with CT of the abdomen performed and without acute findings.    --Echo  --Labs --R/o amyloidosis Order myeloma order set SPEP UPEP CRP, ESR, ANA, RF Cardiac MRI Monoclonal protein    Home Medications    Prior to Admission medications  Medication Sig Start Date End Date Taking? Authorizing Provider  amLODipine (NORVASC) 5 MG tablet Take 1 tablet (5 mg total) by mouth daily. 08/20/19 12/18/19  Menshew, Dannielle Karvonen, PA-C  aspirin 81 MG tablet Take 81 mg by mouth daily.    [provider]  calcium carbonate (OS-CAL - DOSED IN MG OF ELEMENTAL CALCIUM) 1250 (500 Ca) MG tablet Take 1 tablet by mouth daily with breakfast.    [provider]  cyclobenzaprine (FLEXERIL) 5 MG tablet Take 1 tablet (5 mg total) by mouth 3 (three) times daily as needed. 08/20/19   Menshew, Dannielle Karvonen, PA-C  meloxicam (MOBIC) 15 MG tablet Take 1 tablet (15 mg  total) by mouth daily. 08/20/19 11/18/19  Menshew, Dannielle Karvonen, PA-C  pantoprazole (PROTONIX) 20 MG tablet Take 1 tablet (20 mg total) by mouth daily. 10/16/19 11/15/19  Vanessa Flint [place], MD  ranitidine (ZANTAC) 75 MG tablet Take 75 mg by mouth at bedtime.    [provider]    Review of Systems    ***.   All other systems reviewed and are otherwise negative except as noted above.  Physical Exam    VS:  LMP 02/23/2015  , BMI There is no height or weight on file to calculate BMI. GEN: Well nourished, well developed, in no acute distress. HEENT: normal. Neck: Supple, no JVD, carotid bruits, or masses. Cardiac: RRR, no murmurs, rubs, or gallops. No clubbing, cyanosis, edema.  Radials/DP/PT 2+ and equal bilaterally.  Respiratory:  Respirations regular and unlabored, clear to auscultation bilaterally. GI: Soft, nontender, nondistended, BS + x 4. MS: no deformity or atrophy. Skin: warm and dry, no rash. Neuro:  Strength and sensation are intact. Psych: Normal affect.  Accessory Clinical Findings    ECG personally reviewed by me today - *** - no acute changes.  VITALS Reviewed today   Temp Readings from Last 3 Encounters:  10/21/19 98.4 F (36.9 C) (Oral)  10/16/19 98 F (36.7 C) (Oral)  08/20/19 98.2 F (36.8 C) (Oral)   BP Readings from Last 3 Encounters:  10/22/19 118/74  10/16/19 131/77  08/20/19 (!) 165/93   Pulse Readings from Last 3 Encounters:  10/22/19 88  10/16/19 69  08/20/19 77    Wt Readings from Last 3 Encounters:  10/21/19 163 lb (73.9 kg)  10/16/19 163 lb (73.9 kg)  08/20/19 192 lb (87.1 kg)     LABS  reviewed today   Lab Results  Component Value Date   WBC 15.5 (H) 10/21/2019   HGB 11.0 (L) 10/21/2019   HCT 36.4 10/21/2019   MCV 100.0 10/21/2019   PLT 219 10/21/2019   Lab Results  Component Value Date   CREATININE 1.02 (H) 10/21/2019   BUN 43 (H) 10/21/2019   NA 139 10/21/2019   K 4.1 10/21/2019   CL 109 10/21/2019   CO2 18 (L)  10/21/2019   Lab Results  Component Value Date   ALT 14 10/16/2019   AST 21 10/16/2019   ALKPHOS 101 10/16/2019   BILITOT 0.6 10/16/2019   Lab Results  Component Value Date   CHOL 132 01/16/2016   HDL 60 01/16/2016   LDLCALC 65 01/16/2016   TRIG 33 01/16/2016   CHOLHDL 2.2 01/16/2016    No results found for: HGBA1C Lab Results  Component Value Date   TSH 2.615 01/16/2016     STUDIES/PROCEDURES reviewed today   *** 01/2016 TEE - Left ventricle: The cavity size was normal. Wall thickness was  normal.  Systolic function was normal. The estimated ejection  fraction was 65%.  - Left atrium: No evidence of thrombus in the atrial cavity or  appendage. No evidence of thrombus in the appendage.  - Right atrium: No evidence of thrombus in the atrial cavity or  appendage.  - Atrial septum: The septum was thickened. No defect or patent  foramen ovale was identified. Echo contrast study showed no  right-to-left atrial level shunt, following an increase in RA  pressure induced by provocative maneuvers. Echo contrast study  showed no right-to-left atrial level shunt, at baseline or with  provocation.  - Pulmonic valve: No evidence of vegetation.  - Pulmonary arteries: PA peak pressure: 32 mm Hg (S).   [facility] 01/2016 Procedural Findings R groin: Hemodynamics RA 4 mmHg RV 8/12 mmHg PA 30/11 mmHg PCWP 18 mmHg LV 145 mmHg . LVEDP: 36 mmHg AO 141/36 with a mean of 64 mmHg Oxygen saturations: PA 72.4 AO 95.6 Cardiac Output (Fick) 4.48 Cardiac Index (Fick) 2.5 to  Aortic Valve: Peak to Peak gradient: 0 mmHg , Mean gradient: 0 mmHg, Valve area: Normal Pulmonary vascular resistance (PVR): 18 dyn Woods units. Coronary angiography: Coronary dominance: Right dominant Left Main: Normal Left Anterior Descending (LAD): Normal 1st diagonal (D1): Normal 2nd diagonal (D2): Normal 3rd diagonal (D3): Normal Circumflex (LCx): Normal 1st obtuse marginal: Normal 2nd  obtuse marginal: Normal 3rd obtuse marginal: Normal  AV groove continuation segment: Normal Ramus Intermedius: Normal Right Coronary Artery: Normal S2 Posterior descending arter Posterior AV segment: Normal  Posterolateral branchs:  Normal Left ventriculography: Left ventricular systolic function is 32 , LVEF is estimated at 60% %, there is mild significant mitral regurgitation  Final Conclusions:   Normal coronaries with normal left reticular systolic function and normal pulmonary pressures and normal cardiac output. Recommendations:  Noncardiac chest pain. Patient underwent right and left heart catheterization which showed normal coronaries, normal left ventricular systolic function, and normal right heart pressures. Saturations were normal as well as pressures on the right heart that there was no evidence of atrial septal or ventricular septal defect. Patient did have very dark arterial blood but the saturation was 95%. In conclusion noncardiac chest pain with no evidence of atrial septal defect on cardiac catheterization as well as TRANSESOPHAGEAL ECHOCARDIOGRAM. THERE IS NO EVIDENCE OF ENDOCARDITIS WITH NO EVIDENCE OF VEGETATION SEEN ON TEE. PATIENT CAN GO HOME WITH FOLLOW-UP IN THE OFFICE AS CHEST PAIN IS MOST LIKELY MUSCULOSKELETAL AND SHORTNESS OF BREATH PROBABLY RELATED TO COPD.  [facility] Stress test 01/2015 Component Name Value Ref Range  ST Change Segment 1  mm  Angina Score for [facility] Treadmill Score    Exercise duration  min  ST Deviation  mm  [facility] Treadmill Score    Baseline HR 67 bpm  Baseline BP 120/91 mmHg  Baseline O2 sat  %  Peak HR 148 bpm  PMHR % 87 %  Peak BP 184/77 mmHg  Peak O2 sat  %  PMHR 147.9 bpm  Exercise duration (min) 6 min  Exercise duration (sec) 0 sec  Stage Reached 2   Estimated workload 7.0 METS  Result Narrative   Negative ETT for ischemia at workload achieved  No significant ST segment changes or arrhythmias were noted during  stress      Assessment & Plan    ***  Medication changes: *** Labs ordered: *** Studies / Imaging ordered: *** Future considerations: *** Disposition: ***  Total time spent with patient today *** minutes. This includes reviewing records, evaluating the  patient, and coordinating care. Face-to-face time >50%.    Arvil Chaco, PA-C 10/25/2019

## 2019-10-26 ENCOUNTER — Encounter: Payer: Self-pay | Admitting: Physician Assistant

## 2020-02-17 ENCOUNTER — Other Ambulatory Visit: Payer: Self-pay | Admitting: Student

## 2020-02-17 DIAGNOSIS — Z1231 Encounter for screening mammogram for malignant neoplasm of breast: Secondary | ICD-10-CM

## 2020-03-06 ENCOUNTER — Telehealth (INDEPENDENT_AMBULATORY_CARE_PROVIDER_SITE_OTHER): Payer: Self-pay | Admitting: Gastroenterology

## 2020-03-06 ENCOUNTER — Other Ambulatory Visit: Payer: Self-pay

## 2020-03-06 DIAGNOSIS — M25619 Stiffness of unspecified shoulder, not elsewhere classified: Secondary | ICD-10-CM | POA: Insufficient documentation

## 2020-03-06 DIAGNOSIS — M25519 Pain in unspecified shoulder: Secondary | ICD-10-CM | POA: Insufficient documentation

## 2020-03-06 DIAGNOSIS — S40019A Contusion of unspecified shoulder, initial encounter: Secondary | ICD-10-CM | POA: Insufficient documentation

## 2020-03-06 DIAGNOSIS — Z1211 Encounter for screening for malignant neoplasm of colon: Secondary | ICD-10-CM

## 2020-03-06 DIAGNOSIS — M75120 Complete rotator cuff tear or rupture of unspecified shoulder, not specified as traumatic: Secondary | ICD-10-CM | POA: Insufficient documentation

## 2020-03-06 DIAGNOSIS — M6281 Muscle weakness (generalized): Secondary | ICD-10-CM | POA: Insufficient documentation

## 2020-03-06 MED ORDER — PEG 3350-KCL-NA BICARB-NACL 420 G PO SOLR: 4000.0000 mL | Freq: Once | ORAL | 0 refills | Status: AC

## 2020-03-06 NOTE — Progress Notes (Signed)
Gastroenterology Pre-Procedure Review  Request Date: Wed 03/22/20 Requesting Physician: Dr. Allegra Lai  PATIENT REVIEW QUESTIONS: The patient responded to the following health history questions as indicated:    1. Are you having any GI issues? no 2. Do you have a personal history of Polyps? no 3. Do you have a family history of Colon Cancer or Polyps? no 4. Diabetes Mellitus? no 5. Joint replacements in the past 12 months?no 6. Major health problems in the past 3 months?ER Visit Chest Pain 05/15 and 05/20 Cardiac Clearance will be sent to Dr. Glennis Brink office 7. Any artificial heart valves, MVP, or defibrillator?no    MEDICATIONS & ALLERGIES:    Patient reports the following regarding taking any anticoagulation/antiplatelet therapy:   Plavix, Coumadin, Eliquis, Xarelto, Lovenox, Pradaxa, Brilinta, or Effient? no Aspirin? yes (81 mg daily)  Patient confirms/reports the following medications:  Current Outpatient Medications  Medication Sig Dispense Refill  . aspirin 81 MG tablet Take 81 mg by mouth daily.    . calcium carbonate (OS-CAL - DOSED IN MG OF ELEMENTAL CALCIUM) 1250 (500 Ca) MG tablet Take 1 tablet by mouth daily with breakfast.    . cyclobenzaprine (FLEXERIL) 5 MG tablet Take 1 tablet (5 mg total) by mouth 3 (three) times daily as needed. 30 tablet 0  . furosemide (LASIX) 20 MG tablet Take 20 mg by mouth daily.    . meloxicam (MOBIC) 15 MG tablet Take 15 mg by mouth daily.    . polyethylene glycol-electrolytes (GAVILYTE-N WITH FLAVOR PACK) 420 g solution Take 4,000 mLs by mouth once for 1 dose. 4000 mL 0  . amLODipine (NORVASC) 5 MG tablet Take 1 tablet (5 mg total) by mouth daily. 30 tablet 3   No current facility-administered medications for this visit.    Patient confirms/reports the following allergies:  No Known Allergies  No orders of the defined types were placed in this encounter.   AUTHORIZATION INFORMATION Primary Insurance: 1D#: Group #:  Secondary  Insurance: 1D#: Group #:  SCHEDULE INFORMATION: Date: Wed 03/22/20 Time: Location:[facility]

## 2020-03-09 ENCOUNTER — Ambulatory Visit
Admission: RE | Admit: 2020-03-09 | Discharge: 2020-03-09 | Disposition: A | Discharge status: Home / Self Care | Payer: BLUE CROSS/BLUE SHIELD | Source: Ambulatory Visit

## 2020-03-09 ENCOUNTER — Other Ambulatory Visit: Payer: Self-pay | Admitting: Student

## 2020-03-09 ENCOUNTER — Telehealth: Payer: Self-pay | Admitting: Gastroenterology

## 2020-03-09 ENCOUNTER — Other Ambulatory Visit: Payer: Self-pay

## 2020-03-09 ENCOUNTER — Ambulatory Visit
Admission: RE | Admit: 2020-03-09 | Discharge: 2020-03-09 | Disposition: A | Discharge status: Home / Self Care | Payer: BLUE CROSS/BLUE SHIELD | Attending: Student | Admitting: Student

## 2020-03-09 DIAGNOSIS — M79641 Pain in right hand: Secondary | ICD-10-CM | POA: Diagnosis present

## 2020-03-09 NOTE — Telephone Encounter (Signed)
Patient decided to cancel procedure and find another GI doctor within network. Clinical staff was informed

## 2020-03-09 NOTE — Telephone Encounter (Signed)
Called ENDO and they are going to cancel her procedure for her

## 2020-03-14 ENCOUNTER — Telehealth: Payer: Self-pay

## 2020-03-14 NOTE — Telephone Encounter (Signed)
Cardiac clearance has been granted for patient to have Colonoscopy.  Colonoscopy is scheduled on 03/22/20. Cardiac Clearance completed by Dr. Juliann Pares.  Thanks,  [place], [place]

## 2020-03-22 ENCOUNTER — Ambulatory Visit: Admit: 2020-03-22 | Payer: BLUE CROSS/BLUE SHIELD | Admitting: Gastroenterology

## 2020-03-22 SURGERY — COLONOSCOPY WITH PROPOFOL
Anesthesia: General

## 2020-08-27 ENCOUNTER — Other Ambulatory Visit: Payer: Self-pay

## 2020-08-27 ENCOUNTER — Emergency Department: Payer: Self-pay

## 2020-08-27 ENCOUNTER — Emergency Department
Admission: EM | Admit: 2020-08-27 | Discharge: 2020-08-28 | Disposition: A | Discharge status: Home / Self Care | Payer: Self-pay | Attending: Emergency Medicine | Admitting: Emergency Medicine

## 2020-08-27 DIAGNOSIS — I2510 Atherosclerotic heart disease of native coronary artery without angina pectoris: Secondary | ICD-10-CM | POA: Insufficient documentation

## 2020-08-27 DIAGNOSIS — Z7982 Long term (current) use of aspirin: Secondary | ICD-10-CM | POA: Insufficient documentation

## 2020-08-27 DIAGNOSIS — R2242 Localized swelling, mass and lump, left lower limb: Secondary | ICD-10-CM | POA: Insufficient documentation

## 2020-08-27 DIAGNOSIS — F17200 Nicotine dependence, unspecified, uncomplicated: Secondary | ICD-10-CM | POA: Insufficient documentation

## 2020-08-27 DIAGNOSIS — I10 Essential (primary) hypertension: Secondary | ICD-10-CM | POA: Insufficient documentation

## 2020-08-27 DIAGNOSIS — S8392XA Sprain of unspecified site of left knee, initial encounter: Secondary | ICD-10-CM | POA: Insufficient documentation

## 2020-08-27 DIAGNOSIS — W19XXXA Unspecified fall, initial encounter: Secondary | ICD-10-CM | POA: Insufficient documentation

## 2020-08-27 HISTORY — DX: Cerebral infarction, unspecified: I63.9

## 2020-08-27 HISTORY — DX: Acute myocardial infarction, unspecified: I21.9

## 2020-08-27 LAB — COMPREHENSIVE METABOLIC PANEL
ALT: 14 U/L (ref 0–44)
AST: 21 U/L (ref 15–41)
Albumin: 4.2 g/dL (ref 3.5–5.0)
Alkaline Phosphatase: 100 U/L (ref 38–126)
Anion gap: 7 (ref 5–15)
BUN: 30 mg/dL — ABNORMAL HIGH (ref 6–20)
CO2: 25 mmol/L (ref 22–32)
Calcium: 9.6 mg/dL (ref 8.9–10.3)
Chloride: 108 mmol/L (ref 98–111)
Creatinine, Ser: 1.25 mg/dL — ABNORMAL HIGH (ref 0.44–1.00)
GFR, Estimated: 51 mL/min — ABNORMAL LOW (ref >60)
Glucose, Bld: 101 mg/dL — ABNORMAL HIGH (ref 70–99)
Potassium: 3.5 mmol/L (ref 3.5–5.1)
Sodium: 140 mmol/L (ref 135–145)
Total Bilirubin: 0.4 mg/dL (ref 0.3–1.2)
Total Protein: 7.8 g/dL (ref 6.5–8.1)

## 2020-08-27 LAB — CBC WITH DIFFERENTIAL/PLATELET
Abs Immature Granulocytes: 0.04 10*3/uL (ref 0.00–0.07)
Basophils Absolute: 0 10*3/uL (ref 0.0–0.1)
Basophils Relative: 0 %
Eosinophils Absolute: 0.1 10*3/uL (ref 0.0–0.5)
Eosinophils Relative: 1 %
HCT: 40 % (ref 36.0–46.0)
Hemoglobin: 13 g/dL (ref 12.0–15.0)
Immature Granulocytes: 0 %
Lymphocytes Relative: 21 %
Lymphs Abs: 2.1 10*3/uL (ref 0.7–4.0)
MCH: 29.7 pg (ref 26.0–34.0)
MCHC: 32.5 g/dL (ref 30.0–36.0)
MCV: 91.5 fL (ref 80.0–100.0)
Monocytes Absolute: 0.6 10*3/uL (ref 0.1–1.0)
Monocytes Relative: 6 %
Neutro Abs: 7.1 10*3/uL (ref 1.7–7.7)
Neutrophils Relative %: 72 %
Platelets: 272 10*3/uL (ref 150–400)
RBC: 4.37 MIL/uL (ref 3.87–5.11)
RDW: 13.2 % (ref 11.5–15.5)
WBC: 9.9 10*3/uL (ref 4.0–10.5)
nRBC: 0 % (ref 0.0–0.2)

## 2020-08-27 LAB — BRAIN NATRIURETIC PEPTIDE: B Natriuretic Peptide: 28.4 pg/mL (ref 0.0–100.0)

## 2020-08-27 MED ORDER — ACETAMINOPHEN 500 MG PO TABS
1000.0000 mg | ORAL_TABLET | Freq: Once | ORAL | Status: AC
  Administered 2020-08-27: 1000 mg via ORAL
  Filled 2020-08-27: qty 2

## 2020-08-27 NOTE — ED Provider Notes (Signed)
[facility] Emergency Department Provider Note  ____________________________________________  Time seen: Approximately 11:29 PM  I have reviewed the triage vital signs and the nursing notes.   HISTORY  Chief Complaint Leg Swelling   HPI Samantha Wells is a 56 y.o. female with a history of hypertension, CAD status post MI, CVA who presents for evaluation of left knee pain.  Patient reports that she has had left knee pain and swelling for the last 2 days.  The pain is worse with ambulation.  She does report a fall a couple of days before the pain started but does not recall hitting her knee or twisting it.  She describes the pain is severe and sharp.  No redness, no prior history of gout or pseudogout, no surgeries to the knee, no prior history of PE or DVT, no chest pain or shortness of breath, no hemoptysis, no exogenous hormones, no recent long distance travel or immobilization.   She has not tried anything at home for the pain.  Past Medical History:  Diagnosis Date  . Hypertension   . MI (myocardial infarction) (HCC)   . Stroke [facility])     Patient Active Problem List   Diagnosis Date Noted  . Contusion of shoulder region 03/06/2020  . Full thickness rotator cuff tear 03/06/2020  . Muscle weakness 03/06/2020  . Shoulder joint pain 03/06/2020  . Stiffness of shoulder joint 03/06/2020  . Chest pain, rule out acute myocardial infarction 01/16/2016  . Chest pain 01/16/2016  . Chest pain, unspecified 01/16/2016  . Musculoskeletal chest pain 01/09/2016  . Abdominal pain, epigastric 01/09/2016  . Essential hypertension, benign 01/09/2016  . Hypokalemia 01/09/2016  . Hyperglycemia 01/09/2016    Past Surgical History:  Procedure Laterality Date  . CARDIAC CATHETERIZATION N/A 01/16/2016   Procedure: Right/Left Heart Cath and Coronary Angiography;  Surgeon: Laurier Nancy, MD;  Location: [facility] INVASIVE CV LAB;  Service: Cardiovascular;  Laterality: N/A;  .  CESAREAN SECTION    . Left Shoulder Repair Left   . TUBAL LIGATION      Prior to Admission medications   Medication Sig Start Date End Date Taking? Authorizing Provider  acetaminophen (TYLENOL) 500 MG tablet Take 2 tablets (1,000 mg total) by mouth every 8 (eight) hours as needed for mild pain, moderate pain, fever or headache. 08/28/20 08/28/21 Yes Don Perking, [place], MD  diclofenac Sodium (VOLTAREN) 1 % GEL Apply 2 g topically 4 (four) times daily. 08/28/20  Yes Don Perking, [place], MD  amLODipine (NORVASC) 5 MG tablet Take 1 tablet (5 mg total) by mouth daily. 08/20/19 12/18/19  Menshew, Charlesetta Ivory, PA-C  aspirin 81 MG tablet Take 81 mg by mouth daily.    [provider]  calcium carbonate (OS-CAL - DOSED IN MG OF ELEMENTAL CALCIUM) 1250 (500 Ca) MG tablet Take 1 tablet by mouth daily with breakfast.    [provider]  cyclobenzaprine (FLEXERIL) 5 MG tablet Take 1 tablet (5 mg total) by mouth 3 (three) times daily as needed. 08/20/19   Menshew, Charlesetta Ivory, PA-C  furosemide (LASIX) 20 MG tablet Take 20 mg by mouth daily. 02/27/20   [provider]  meloxicam (MOBIC) 15 MG tablet Take 15 mg by mouth daily. 02/19/20   [provider]    Allergies Oxycodone  Family History  Problem Relation Age of Onset  . CAD Mother   . Hypertension Sister     Social History Social History   Tobacco Use  . Smoking status: Current  Some Day Smoker    Last attempt to quit: 02/15/2017    Years since quitting: 3.5  . Smokeless tobacco: Never Used  Vaping Use  . Vaping Use: Never used  Substance Use Topics  . Alcohol use: No  . Drug use: No    Review of Systems  Constitutional: Negative for fever. Eyes: Negative for visual changes. ENT: Negative for sore throat. Neck: No neck pain  Cardiovascular: Negative for chest pain. Respiratory: Negative for shortness of breath. Gastrointestinal: Negative for abdominal pain, vomiting or diarrhea. Genitourinary:  Negative for dysuria. Musculoskeletal: Negative for back pain. + L knee pain Skin: Negative for rash. Neurological: Negative for headaches, weakness or numbness. Psych: No SI or HI  ____________________________________________   PHYSICAL EXAM:  VITAL SIGNS: ED Triage Vitals  Enc Vitals Group     BP 08/27/20 2230 139/73     Pulse Rate 08/27/20 2230 78     Resp 08/27/20 2230 18     Temp 08/27/20 2230 98.7 F (37.1 C)     Temp Source 08/27/20 2230 Oral     SpO2 08/27/20 2230 100 %     Weight 08/27/20 2233 163 lb (73.9 kg)     Height 08/27/20 2233 5\' 4"  (1.626 m)     Head Circumference --      Peak Flow --      Pain Score 08/27/20 2233 10     Pain Loc --      Pain Edu? --      Excl. in GC? --     Constitutional: Alert and oriented. Well appearing and in no apparent distress. HEENT:      Head: Normocephalic and atraumatic.         Eyes: Conjunctivae are normal. Sclera is non-icteric.       Mouth/Throat: Mucous membranes are moist.       Neck: Supple with no signs of meningismus. Cardiovascular: Regular rate and rhythm. No murmurs, gallops, or rubs. 2+ symmetrical distal pulses are present in all extremities. No JVD. Respiratory: Normal respiratory effort. Lungs are clear to auscultation bilaterally.  Gastrointestinal: Soft, non tender. Musculoskeletal:  No edema, cyanosis, or erythema of extremities. L knee is mildly swollen but seems identical to the R knee, no erythema, no warmth, full ROM with minimal discomfort, no obvious deformities. Neurologic: Normal speech and language. Face is symmetric. Moving all extremities. No gross focal neurologic deficits are appreciated. Skin: Skin is warm, dry and intact. No rash noted. Psychiatric: Mood and affect are normal. Speech and behavior are normal.  ____________________________________________   LABS (all labs ordered are listed, but only abnormal results are displayed)  Labs Reviewed  COMPREHENSIVE METABOLIC PANEL -  Abnormal; Notable for the following components:      Result Value   Glucose, Bld 101 (*)    BUN 30 (*)    Creatinine, Ser 1.25 (*)    GFR, Estimated 51 (*)    All other components within normal limits  CBC WITH DIFFERENTIAL/PLATELET  BRAIN NATRIURETIC PEPTIDE   ____________________________________________  EKG  none  ____________________________________________  RADIOLOGY  I have personally reviewed the images performed during this visit and I agree with the Radiologist's read.   Interpretation by Radiologist:  2234 Venous Img Lower Unilateral Left  Result Date: 08/28/2020 CLINICAL DATA:  Left leg swelling EXAM: LEFT LOWER EXTREMITY VENOUS DOPPLER ULTRASOUND TECHNIQUE: Gray-scale sonography with compression, as well as color and duplex ultrasound, were performed to evaluate the deep venous system(s) from the level of the common  femoral vein through the popliteal and proximal calf veins. COMPARISON:  None. FINDINGS: VENOUS Normal compressibility of the common femoral, superficial femoral, and popliteal veins, as well as the visualized calf veins. Visualized portions of profunda femoral vein and great saphenous vein unremarkable. No filling defects to suggest DVT on grayscale or color Doppler imaging. Doppler waveforms show normal direction of venous flow, normal respiratory plasticity and response to augmentation. Limited views of the contralateral common femoral vein are unremarkable. OTHER None. Limitations: none IMPRESSION: Negative. Electronically Signed   By: Helyn NumbersAshesh  Parikh MD   On: 08/28/2020 00:02   DG Knee Complete 4 Views Left  Result Date: 08/27/2020 CLINICAL DATA:  Left knee pain.  Leg pain and swelling for 2 days. EXAM: LEFT KNEE - COMPLETE 4+ VIEW COMPARISON:  Radiograph 08/20/2019 FINDINGS: No evidence of fracture, dislocation, or joint effusion. Mild peripheral spurring is unchanged from prior exam, most prominent in the medial tibiofemoral compartment. Soft tissues are  unremarkable. IMPRESSION: No acute findings. Mild degenerative peripheral spurring, unchanged from prior exam. Electronically Signed   By: Narda RutherfordMelanie  Sanford M.D.   On: 08/27/2020 23:35      ____________________________________________   PROCEDURES  Procedure(s) performed: None Procedures Critical Care performed:  None ____________________________________________   INITIAL IMPRESSION / ASSESSMENT AND PLAN / ED COURSE  55 y.o. female with a history of hypertension, CAD status post MI, CVA who presents for evaluation of left knee pain x 2 days. Recent fall.  Patient is well-appearing in no distress with normal vital signs, left knee is slightly swollen but he seems equal when compared to the right knee, there is no asymmetric leg swelling or pitting edema, there is no signs of cellulitis, inflammatory or septic joint with intact range of motion causing minimal discomfort.  There is no bony abnormalities or bruising.  Leg is warm and well perfused with strong distal pulses and brisk capillary refill.  Ddx knee sprain, arthritis, DVT  We will get an x-ray of the knee and a Doppler ultrasound. Will give tylenol for pain  _________________________ 12:29 AM on 08/28/2020 -----------------------------------------  X-ray visualized by me with no acute findings, confirmed by radiology.  Venous ultrasound negative for DVT.  Will discharge patient on Voltaren gel, extra strength Tylenol, Ace wrap and crutches for comfort and referral to orthopedics.  Recommended the above treatment for 3 days and if she still having pain recommended follow-up with orthopedics.  Discussed return precautions for any signs of infectious/inflammatory joint, worsening pain, or new symptoms.  Work-up, results, recommendations, and treatment were discussed with patient's cousin over the phone per patient's request.  Patient requesting a work note for 3 days to allow for treatment to work.  That was provided as well      _____________________________________________ Please note:  Patient was evaluated in Emergency Department today for the symptoms described in the history of present illness. Patient was evaluated in the context of the global COVID-19 pandemic, which necessitated consideration that the patient might be at risk for infection with the SARS-CoV-2 virus that causes COVID-19. Institutional protocols and algorithms that pertain to the evaluation of patients at risk for COVID-19 are in a state of rapid change based on information released by regulatory bodies including the CDC and federal and state organizations. These policies and algorithms were followed during the patient's care in the ED.  Some ED evaluations and interventions may be delayed as a result of limited staffing during the pandemic.   [place] Controlled Substance Database was reviewed by me.  ____________________________________________   FINAL CLINICAL IMPRESSION(S) / ED DIAGNOSES   Final diagnoses:  Sprain of left knee, unspecified ligament, initial encounter      NEW MEDICATIONS STARTED DURING THIS VISIT:  ED Discharge Orders         Ordered    diclofenac Sodium (VOLTAREN) 1 % GEL  4 times daily        08/28/20 0028    acetaminophen (TYLENOL) 500 MG tablet  Every 8 hours PRN        08/28/20 0028           Note:  This document was prepared using Dragon voice recognition software and may include unintentional dictation errors.    Don Perking, [place], MD 08/28/20 [place]

## 2020-08-27 NOTE — ED Triage Notes (Signed)
Pt via EMS from home for c/o L leg pain and swelling x 2 days.  Denies hx of blood clots. Hx of CVA, MI, HTN.  BP 148/92

## 2020-08-28 MED ORDER — ACETAMINOPHEN 500 MG PO TABS: 1000.0000 mg | ORAL_TABLET | Freq: Three times a day (TID) | ORAL | 0 refills | Status: AC | PRN

## 2020-08-28 MED ORDER — DICLOFENAC SODIUM 1 % EX GEL: 2.0000 g | Freq: Four times a day (QID) | CUTANEOUS | 0 refills | Status: AC

## 2020-08-28 NOTE — Discharge Instructions (Addendum)
Apply Voltaren gel and massage the knee 3-4 times a day. Take 1000mg  of tylenol 3 times a day for the next few days. Use ACE wrap as needed for comfort. If your symptoms are not improving in 5 days, follow up at [facility]. Return to the ER for worsening pain, swelling, redness or warmth of the knee.

## 2020-10-02 ENCOUNTER — Emergency Department
Admission: EM | Admit: 2020-10-02 | Discharge: 2020-10-02 | Disposition: A | Discharge status: Home / Self Care | Payer: BLUE CROSS/BLUE SHIELD | Attending: Physician Assistant | Admitting: Physician Assistant

## 2020-10-02 ENCOUNTER — Encounter: Payer: Self-pay | Admitting: Emergency Medicine

## 2020-10-02 ENCOUNTER — Other Ambulatory Visit: Payer: Self-pay

## 2020-10-02 DIAGNOSIS — M25562 Pain in left knee: Secondary | ICD-10-CM | POA: Insufficient documentation

## 2020-10-02 DIAGNOSIS — F17200 Nicotine dependence, unspecified, uncomplicated: Secondary | ICD-10-CM | POA: Insufficient documentation

## 2020-10-02 DIAGNOSIS — Z7982 Long term (current) use of aspirin: Secondary | ICD-10-CM | POA: Diagnosis not present

## 2020-10-02 DIAGNOSIS — I10 Essential (primary) hypertension: Secondary | ICD-10-CM | POA: Diagnosis not present

## 2020-10-02 DIAGNOSIS — Z79899 Other long term (current) drug therapy: Secondary | ICD-10-CM | POA: Insufficient documentation

## 2020-10-02 HISTORY — DX: Unspecified osteoarthritis, unspecified site: M19.90

## 2020-10-02 MED ORDER — MELOXICAM 15 MG PO TABS: 15.0000 mg | ORAL_TABLET | Freq: Every day | ORAL | 0 refills | Status: DC

## 2020-10-02 MED ORDER — NAPROXEN 500 MG PO TABS
500.0000 mg | ORAL_TABLET | Freq: Once | ORAL | Status: AC
  Administered 2020-10-02: 500 mg via ORAL
  Filled 2020-10-02: qty 1

## 2020-10-02 MED ORDER — LIDOCAINE 5 % EX PTCH
1.0000 | MEDICATED_PATCH | CUTANEOUS | Status: DC
  Administered 2020-10-02: 1 via TRANSDERMAL
  Filled 2020-10-02: qty 1

## 2020-10-02 MED ORDER — TRAMADOL HCL 50 MG PO TABS
50.0000 mg | ORAL_TABLET | Freq: Once | ORAL | Status: AC
  Administered 2020-10-02: 50 mg via ORAL
  Filled 2020-10-02: qty 1

## 2020-10-02 NOTE — ED Provider Notes (Signed)
[facility] Emergency Department Provider Note   ____________________________________________   Event Date/Time   First MD Initiated Contact with Patient 10/02/20 [phone]     (approximate)  I have reviewed the triage vital signs and the nursing notes.   HISTORY  Chief Complaint Knee Pain  Rates pain as a 10/10.  Described pain as "achy".  HPI Samantha Wells is a 55 y.o. female patient patient arrived via EMS from work complaining of left knee pain.  Patient has a history of early arthritis and is followed by [facility].  Patient states she had a cortisone shot approximately 3 weeks ago which was working fine until this morning.  Patient denies provoking incident for complaint.  Patient stated pain increased with weightbearing.  Patient rates pain as a 10/10.  Patient described pain as "achy".  No palliative measures prior to arrival.         Past Medical History:  Diagnosis Date  . Arthritis   . Hypertension   . MI (myocardial infarction) (HCC)   . Stroke [facility])     Patient Active Problem List   Diagnosis Date Noted  . Contusion of shoulder region 03/06/2020  . Full thickness rotator cuff tear 03/06/2020  . Muscle weakness 03/06/2020  . Shoulder joint pain 03/06/2020  . Stiffness of shoulder joint 03/06/2020  . Chest pain, rule out acute myocardial infarction 01/16/2016  . Chest pain 01/16/2016  . Chest pain, unspecified 01/16/2016  . Musculoskeletal chest pain 01/09/2016  . Abdominal pain, epigastric 01/09/2016  . Essential hypertension, benign 01/09/2016  . Hypokalemia 01/09/2016  . Hyperglycemia 01/09/2016    Past Surgical History:  Procedure Laterality Date  . CARDIAC CATHETERIZATION N/A 01/16/2016   Procedure: Right/Left Heart Cath and Coronary Angiography;  Surgeon: Laurier Nancy, MD;  Location: [facility] INVASIVE CV LAB;  Service: Cardiovascular;  Laterality: N/A;  . CESAREAN SECTION    . Left Shoulder Repair Left   . TUBAL LIGATION       Prior to Admission medications   Medication Sig Start Date End Date Taking? Authorizing Provider  meloxicam (MOBIC) 15 MG tablet Take 1 tablet (15 mg total) by mouth daily. 10/02/20  Yes Joni Reining, PA-C  acetaminophen (TYLENOL) 500 MG tablet Take 2 tablets (1,000 mg total) by mouth every 8 (eight) hours as needed for mild pain, moderate pain, fever or headache. 08/28/20 08/28/21  Don Perking, [place], MD  amLODipine (NORVASC) 5 MG tablet Take 1 tablet (5 mg total) by mouth daily. 08/20/19 12/18/19  Menshew, Charlesetta Ivory, PA-C  aspirin 81 MG tablet Take 81 mg by mouth daily.    [provider]  calcium carbonate (OS-CAL - DOSED IN MG OF ELEMENTAL CALCIUM) 1250 (500 Ca) MG tablet Take 1 tablet by mouth daily with breakfast.    [provider]  cyclobenzaprine (FLEXERIL) 5 MG tablet Take 1 tablet (5 mg total) by mouth 3 (three) times daily as needed. 08/20/19   Menshew, Charlesetta Ivory, PA-C  diclofenac Sodium (VOLTAREN) 1 % GEL Apply 2 g topically 4 (four) times daily. 08/28/20   Nita Sickle, MD  furosemide (LASIX) 20 MG tablet Take 20 mg by mouth daily. 02/27/20   [provider]  meloxicam (MOBIC) 15 MG tablet Take 15 mg by mouth daily. 02/19/20   [provider]    Allergies Oxycodone  Family History  Problem Relation Age of Onset  . CAD Mother   . Hypertension Sister     Social History Social History  Tobacco Use  . Smoking status: Current Some Day Smoker    Last attempt to quit: 02/15/2017    Years since quitting: 3.6  . Smokeless tobacco: Never Used  Vaping Use  . Vaping Use: Never used  Substance Use Topics  . Alcohol use: No  . Drug use: No    Review of Systems  Constitutional: No fever/chills Eyes: No visual changes. ENT: No sore throat. Cardiovascular: Denies chest pain. Respiratory: Denies shortness of breath. Gastrointestinal: No abdominal pain.  No nausea, no vomiting.  No diarrhea.  No constipation. Genitourinary:  Negative for dysuria. Musculoskeletal: Left knee pain.   Skin: Negative for rash. Neurological: Negative for headaches, focal weakness or numbness. Endocrine:  Hypertension. Allergic/Immunilogical: Oxycodone ____________________________________________   PHYSICAL EXAM:  VITAL SIGNS: ED Triage Vitals 10/02/20 0806  Enc Vitals Group     BP 136/76     Pulse Rate 73     Resp 17     Temp 98.1 F (36.7 C)     Temp Source Oral     SpO2 98 %     Weight 163 lb 2.3 oz (74 kg)     Height 5\' 3"  (1.6 m)     Head Circumference      Peak Flow      Pain Score 10     Pain Loc      Pain Edu?      Excl. in GC?     Constitutional: Alert and oriented. Well appearing and in no acute distress. Cardiovascular: Normal rate, regular rhythm. Grossly normal heart sounds.  Good peripheral circulation. Respiratory: Normal respiratory effort.  No retractions. Lungs CTAB. Genitourinary: Deferred Musculoskeletal: No obvious deformity to the left knee.  Moderate guarding palpation inferior aspect of the femur patella joint.  No joint effusions. Neurologic:  Normal speech and language. No gross focal neurologic deficits are appreciated. No gait instability. Skin:  Skin is warm, dry and intact. No rash noted. Psychiatric: Mood and affect are normal. Speech and behavior are normal.  ____________________________________________   LABS (all labs ordered are listed, but only abnormal results are displayed)  Labs Reviewed - No data to display ____________________________________________  EKG   ____________________________________________  RADIOLOGY I, , personally viewed and evaluated these images (plain radiographs) as part of my medical decision making, as well as reviewing the written report by the radiologist.  ED MD interpretation:    Official radiology report(s): No results found.  ____________________________________________   PROCEDURES  Procedure(s) performed  (including Critical Care):  Procedures   ____________________________________________   INITIAL IMPRESSION / ASSESSMENT AND PLAN / ED COURSE  As part of my medical decision making, I reviewed the following data within the electronic MEDICAL RECORD NUMBER         Patient presents with onset of left knee pain.  No provocative or complaint.  Patient diagnosed with mild degenerative changes and is followed by [facility].  Reviewed patient x-rays consistent with diagnosis mild to moderate degenerative changes.  Patient given Lidoderm patch in place and elastic knee support.  Patient given a work note will follow-up with [facility] for reevaluation.      ____________________________________________   FINAL CLINICAL IMPRESSION(S) / ED DIAGNOSES  Final diagnoses:  Acute pain of left knee     ED Discharge Orders         Ordered    meloxicam (MOBIC) 15 MG tablet  Daily        10/02/20 0822          *  Please note:  Samantha Wells was evaluated in Emergency Department on 10/02/2020 for the symptoms described in the history of present illness. She was evaluated in the context of the global COVID-19 pandemic, which necessitated consideration that the patient might be at risk for infection with the SARS-CoV-2 virus that causes COVID-19. Institutional protocols and algorithms that pertain to the evaluation of patients at risk for COVID-19 are in a state of rapid change based on information released by regulatory bodies including the CDC and federal and state organizations. These policies and algorithms were followed during the patient's care in the ED.  Some ED evaluations and interventions may be delayed as a result of limited staffing during and the pandemic.*   Note:  This document was prepared using Dragon voice recognition software and may include unintentional dictation errors.    Joni Reining, PA-C 10/02/20 8242    Willy Eddy, MD 10/02/20 [phone]

## 2020-10-02 NOTE — Discharge Instructions (Signed)
Wear Lidoderm patch for 12 hours.  Use elastic knee support while ambulating.  Do not sleep  with elastic knee support.  Follow discharge care instructions and schedule appointment with [facility] for reevaluation.  Take medication as directed.

## 2020-10-02 NOTE — ED Triage Notes (Signed)
Presents via EMS with left knee pain  States she did not have a fall  Was seen for same in past  dx 'd with arthritis.  States pain is moving into lower leg  Unable to bear wt

## 2021-05-09 ENCOUNTER — Other Ambulatory Visit: Payer: Self-pay | Admitting: Podiatry

## 2021-05-16 ENCOUNTER — Encounter: Payer: Self-pay | Admitting: Podiatry

## 2021-05-17 ENCOUNTER — Encounter: Payer: Self-pay | Admitting: Anesthesiology

## 2021-05-21 NOTE — Discharge Instructions (Signed)
[facility]  POST OPERATIVE INSTRUCTIONS FOR DR. FOWLER AND [facility] PODIATRY DEPARTMENT   Take your medication as prescribed.  Pain medication should be taken only as needed.  Keep the dressing clean, dry and intact.  Keep your foot elevated above the heart level for the first 48 hours.  Walking to the bathroom and brief periods of walking are acceptable, unless we have instructed you to be non-weight bearing.  Always wear your post-op shoe when walking.  Always use your crutches if you are to be non-weight bearing.  Do not take a shower. Baths are permissible as long as the foot is kept out of the water.   Every hour you are awake:  Bend your knee 15 times. Flex foot 15 times Massage calf 15 times  Call [facility] ([phone]) if any of the following problems occur: You develop a temperature or fever. The bandage becomes saturated with blood. Medication does not stop your pain. Injury of the foot occurs. Any symptoms of infection including redness, odor, or red streaks running from wound. 

## 2021-05-23 ENCOUNTER — Ambulatory Visit: Admission: RE | Admit: 2021-05-23 | Payer: BLUE CROSS/BLUE SHIELD | Source: Home / Self Care | Admitting: Podiatry

## 2021-05-23 HISTORY — DX: Gastro-esophageal reflux disease without esophagitis: K21.9

## 2021-05-23 SURGERY — BUNIONECTOMY
Anesthesia: Choice | Site: Toe | Laterality: Left

## 2022-08-12 ENCOUNTER — Other Ambulatory Visit: Payer: Self-pay | Admitting: Podiatry

## 2022-08-20 ENCOUNTER — Encounter: Payer: Self-pay | Admitting: Podiatry

## 2022-08-21 ENCOUNTER — Encounter: Payer: Self-pay | Admitting: Anesthesiology

## 2022-08-21 ENCOUNTER — Other Ambulatory Visit: Payer: Self-pay | Admitting: Podiatry

## 2022-08-27 ENCOUNTER — Encounter
Admission: RE | Admit: 2022-08-27 | Discharge: 2022-08-27 | Disposition: A | Discharge status: Home / Self Care | Payer: Medicaid Other | Source: Ambulatory Visit | Attending: Podiatry | Admitting: Podiatry

## 2022-08-27 ENCOUNTER — Other Ambulatory Visit: Payer: Self-pay

## 2022-08-27 VITALS — Ht 62.0 in | Wt 207.0 lb

## 2022-08-27 DIAGNOSIS — R739 Hyperglycemia, unspecified: Secondary | ICD-10-CM

## 2022-08-27 DIAGNOSIS — I10 Essential (primary) hypertension: Secondary | ICD-10-CM | POA: Diagnosis not present

## 2022-08-27 DIAGNOSIS — Z01810 Encounter for preprocedural cardiovascular examination: Secondary | ICD-10-CM | POA: Diagnosis not present

## 2022-08-27 DIAGNOSIS — Z01818 Encounter for other preprocedural examination: Secondary | ICD-10-CM | POA: Insufficient documentation

## 2022-08-27 DIAGNOSIS — E876 Hypokalemia: Secondary | ICD-10-CM

## 2022-08-27 LAB — BASIC METABOLIC PANEL
Anion gap: 15 (ref 5–15)
BUN: 14 mg/dL (ref 6–20)
CO2: 25 mmol/L (ref 22–32)
Calcium: 10 mg/dL (ref 8.9–10.3)
Chloride: 97 mmol/L — ABNORMAL LOW (ref 98–111)
Creatinine, Ser: 0.88 mg/dL (ref 0.44–1.00)
GFR, Estimated: >60 mL/min (ref >60)
Glucose, Bld: 96 mg/dL (ref 70–99)
Potassium: 3.4 mmol/L — ABNORMAL LOW (ref 3.5–5.1)
Sodium: 137 mmol/L (ref 135–145)

## 2022-08-27 LAB — CBC
HCT: 43 % (ref 36.0–46.0)
Hemoglobin: 13.5 g/dL (ref 12.0–15.0)
MCH: 28.6 pg (ref 26.0–34.0)
MCHC: 31.4 g/dL (ref 30.0–36.0)
MCV: 91.1 fL (ref 80.0–100.0)
Platelets: 311 10*3/uL (ref 150–400)
RBC: 4.72 MIL/uL (ref 3.87–5.11)
RDW: 13.4 % (ref 11.5–15.5)
WBC: 9.2 10*3/uL (ref 4.0–10.5)
nRBC: 0 % (ref 0.0–0.2)

## 2022-08-27 MED ORDER — LACTATED RINGERS IV SOLN: INTRAVENOUS | Status: DC

## 2022-08-27 MED ORDER — ORAL CARE MOUTH RINSE: 15.0000 mL | Freq: Once | OROMUCOSAL | Status: DC

## 2022-08-27 MED ORDER — CEFAZOLIN SODIUM-DEXTROSE 2-4 GM/100ML-% IV SOLN
2.0000 g | INTRAVENOUS | Status: AC
  Administered 2022-08-28: 2 g via INTRAVENOUS

## 2022-08-27 MED ORDER — CHLORHEXIDINE GLUCONATE 0.12 % MT SOLN: 15.0000 mL | Freq: Once | OROMUCOSAL | Status: DC

## 2022-08-27 NOTE — Patient Instructions (Addendum)
Your procedure is scheduled on: Wednesday August 28, 2022. Report to the Registration Desk on the 1st floor of the [facility]. To find out your arrival time, please call [phone] between 1PM - 3PM on: Tuesday August 27, 2022. If your arrival time is 6:00 am, do not arrive before that time as the [facility] entrance doors do not open until 6:00 am.  REMEMBER: Instructions that are not followed completely may result in serious medical risk, up to and including death; or upon the discretion of your surgeon and anesthesiologist your surgery may need to be rescheduled.  Do not eat food after midnight the night before surgery.  No gum chewing or hard candies.  You may however, drink CLEAR liquids up to 2 hours before you are scheduled to arrive for your surgery. Do not drink anything within 2 hours of your scheduled arrival time.  Clear liquids include: - water  - apple juice without pulp - black coffee or tea (Do NOT add milk or creamers to the coffee or tea) Do NOT drink anything that is not on this list.   In addition, your doctor has ordered for you to drink the provided:  Ensure Pre-Surgery Clear Carbohydrate Drink  Drinking this carbohydrate drink up to two hours before surgery helps to reduce insulin resistance and improve patient outcomes. Please complete drinking 2 hours before scheduled arrival time.  One week prior to surgery: Stop Anti-inflammatories (NSAIDS) such as Advil, Aleve, Ibuprofen, Motrin, Naproxen, Naprosyn and Aspirin based products such as Excedrin, Goody's Powder, BC Powder. Stop ANY OVER THE COUNTER supplements until after surgery. You may however, continue to take Tylenol if needed for pain up until the day of surgery.  Continue taking all prescribed medications with the exception of the following:   Follow recommendations from Cardiologist or PCP regarding stopping blood thinners.  TAKE ONLY THESE MEDICATIONS THE MORNING OF SURGERY WITH A SIP OF  WATER:  amLODipine (NORVASC) 5 MG  famotidine (PEPCID) 20 MG  Antacid (take one the night before and one on the morning of surgery - helps to prevent nausea after surgery.)   No Alcohol for 24 hours before or after surgery.  No Smoking including e-cigarettes for 24 hours before surgery.  No chewable tobacco products for at least 6 hours before surgery.  No nicotine patches on the day of surgery.  Do not use any "recreational" drugs for at least a week (preferably 2 weeks) before your surgery.  Please be advised that the combination of cocaine and anesthesia may have negative outcomes, up to and including death. If you test positive for cocaine, your surgery will be cancelled.  On the morning of surgery brush your teeth with toothpaste and water, you may rinse your mouth with mouthwash if you wish. Do not swallow any toothpaste or mouthwash.  Use CHG Soap or wipes as directed on instruction sheet.  Do not wear jewelry, make-up, hairpins, clips or nail polish.  Do not wear lotions, powders, or perfumes.   Do not shave body hair from the neck down 48 hours before surgery.  Contact lenses, hearing aids and dentures may not be worn into surgery.  Do not bring valuables to the hospital. [facility] is not responsible for any missing/lost belongings or valuables.   Notify your doctor if there is any change in your medical condition (cold, fever, infection).  Wear comfortable clothing (specific to your surgery type) to the hospital.  After surgery, you can help prevent lung complications by doing  breathing exercises.  Take deep breaths and cough every 1-2 hours. Your doctor may order a device called an Incentive Spirometer to help you take deep breaths. When coughing or sneezing, hold a pillow firmly against your incision with both hands. This is called "splinting." Doing this helps protect your incision. It also decreases belly discomfort.  If you are being admitted to the hospital  overnight, leave your suitcase in the car. After surgery it may be brought to your room.  In case of increased patient census, it may be necessary for you, the patient, to continue your postoperative care in the Same Day Surgery department.  If you are being discharged the day of surgery, you will not be allowed to drive home. You will need a responsible individual to drive you home and stay with you for 24 hours after surgery.   If you are taking public transportation, you will need to have a responsible individual with you.  Please call the [facility] at [phone] if you have any questions about these instructions.  Surgery Visitation Policy:  Patients having surgery or a procedure may have two visitors.  Children under the age of 44 must have an adult with them who is not the patient.  Inpatient Visitation:    Visiting hours are 7 a.m. to 8 p.m. Up to four visitors are allowed at one time in a patient room. The visitors may rotate out with other people during the day.  One visitor age 35 or older may stay with the patient overnight and must be in the room by 8 p.m.     Preparing for Surgery with CHLORHEXIDINE GLUCONATE (CHG) Soap  Chlorhexidine Gluconate (CHG) Soap  o An antiseptic cleaner that kills germs and bonds with the skin to continue killing germs even after washing  o Used for showering the night before surgery and morning of surgery  Before surgery, you can play an important role by reducing the number of germs on your skin.  CHG (Chlorhexidine gluconate) soap is an antiseptic cleanser which kills germs and bonds with the skin to continue killing germs even after washing.  Please do not use if you have an allergy to CHG or antibacterial soaps. If your skin becomes reddened/irritated stop using the CHG.  1. Shower the NIGHT BEFORE SURGERY and the MORNING OF SURGERY with CHG soap.  2. If you choose to wash your hair, wash your hair first as  usual with your normal shampoo.  3. After shampooing, rinse your hair and body thoroughly to remove the shampoo.  4. Use CHG as you would any other liquid soap. You can apply CHG directly to the skin and wash gently with a scrungie or a clean washcloth.  5. Apply the CHG soap to your body only from the neck down. Do not use on open wounds or open sores. Avoid contact with your eyes, ears, mouth, and genitals (private parts). Wash face and genitals (private parts) with your normal soap.  6. Wash thoroughly, paying special attention to the area where your surgery will be performed.  7. Thoroughly rinse your body with warm water.  8. Do not shower/wash with your normal soap after using and rinsing off the CHG soap.  9. Pat yourself dry with a clean towel.  10. Wear clean pajamas to bed the night before surgery.  12. Place clean sheets on your bed the night of your first shower and do not sleep with pets.  13. Shower again with the  CHG soap on the day of surgery prior to arriving at the hospital.  14. Do not apply any deodorants/lotions/powders.  15. Please wear clean clothes to the hospital.

## 2022-08-28 ENCOUNTER — Encounter: Payer: Self-pay | Admitting: Podiatry

## 2022-08-28 ENCOUNTER — Ambulatory Visit: Payer: Medicaid Other | Admitting: Anesthesiology

## 2022-08-28 ENCOUNTER — Other Ambulatory Visit: Payer: Self-pay

## 2022-08-28 ENCOUNTER — Ambulatory Visit: Admission: RE | Admit: 2022-08-28 | Payer: Medicaid Other | Source: Home / Self Care | Admitting: Podiatry

## 2022-08-28 ENCOUNTER — Ambulatory Visit
Admission: RE | Admit: 2022-08-28 | Discharge: 2022-08-28 | Disposition: A | Discharge status: Home / Self Care | Payer: Medicaid Other | Attending: Podiatry | Admitting: Podiatry

## 2022-08-28 ENCOUNTER — Encounter
Admission: RE | Disposition: A | Discharge status: Home / Self Care | Payer: Self-pay | Source: Home / Self Care | Attending: Podiatry

## 2022-08-28 ENCOUNTER — Ambulatory Visit: Payer: Medicaid Other | Admitting: Urgent Care

## 2022-08-28 ENCOUNTER — Ambulatory Visit: Payer: Medicaid Other

## 2022-08-28 DIAGNOSIS — Z79899 Other long term (current) drug therapy: Secondary | ICD-10-CM | POA: Diagnosis not present

## 2022-08-28 DIAGNOSIS — I10 Essential (primary) hypertension: Secondary | ICD-10-CM | POA: Insufficient documentation

## 2022-08-28 DIAGNOSIS — M1990 Unspecified osteoarthritis, unspecified site: Secondary | ICD-10-CM | POA: Diagnosis not present

## 2022-08-28 DIAGNOSIS — Z8249 Family history of ischemic heart disease and other diseases of the circulatory system: Secondary | ICD-10-CM | POA: Insufficient documentation

## 2022-08-28 DIAGNOSIS — M2012 Hallux valgus (acquired), left foot: Secondary | ICD-10-CM | POA: Diagnosis present

## 2022-08-28 HISTORY — PX: BUNIONECTOMY: SHX129

## 2022-08-28 SURGERY — BUNIONECTOMY
Anesthesia: General | Site: Toe | Laterality: Left

## 2022-08-28 SURGERY — BUNIONECTOMY
Anesthesia: Choice | Site: Toe | Laterality: Left

## 2022-08-28 MED ORDER — FENTANYL CITRATE (PF) 100 MCG/2ML IJ SOLN
INTRAMUSCULAR | Status: AC
  Filled 2022-08-28: qty 2

## 2022-08-28 MED ORDER — MIDAZOLAM HCL 2 MG/2ML IJ SOLN
INTRAMUSCULAR | Status: AC
  Filled 2022-08-28: qty 2

## 2022-08-28 MED ORDER — METOCLOPRAMIDE HCL 5 MG/ML IJ SOLN: 5.0000 mg | Freq: Three times a day (TID) | INTRAMUSCULAR | Status: DC | PRN

## 2022-08-28 MED ORDER — DEXAMETHASONE SODIUM PHOSPHATE 10 MG/ML IJ SOLN
INTRAMUSCULAR | Status: AC
  Filled 2022-08-28: qty 1

## 2022-08-28 MED ORDER — EPHEDRINE SULFATE (PRESSORS) 50 MG/ML IJ SOLN
INTRAMUSCULAR | Status: DC | PRN
  Administered 2022-08-28: 10 mg via INTRAVENOUS

## 2022-08-28 MED ORDER — PHENYLEPHRINE HCL (PRESSORS) 10 MG/ML IV SOLN
INTRAVENOUS | Status: DC | PRN
  Administered 2022-08-28: 80 ug via INTRAVENOUS
  Administered 2022-08-28: 160 ug via INTRAVENOUS
  Administered 2022-08-28: 80 ug via INTRAVENOUS
  Administered 2022-08-28 (×2): 160 ug via INTRAVENOUS

## 2022-08-28 MED ORDER — CEFAZOLIN SODIUM-DEXTROSE 2-4 GM/100ML-% IV SOLN
INTRAVENOUS | Status: AC
  Filled 2022-08-28: qty 100

## 2022-08-28 MED ORDER — BUPIVACAINE LIPOSOME 1.3 % IJ SUSP
INTRAMUSCULAR | Status: AC
  Filled 2022-08-28: qty 10

## 2022-08-28 MED ORDER — BUPIVACAINE HCL (PF) 0.5 % IJ SOLN
INTRAMUSCULAR | Status: AC
  Filled 2022-08-28: qty 30

## 2022-08-28 MED ORDER — ONDANSETRON HCL 4 MG/2ML IJ SOLN
INTRAMUSCULAR | Status: DC | PRN
  Administered 2022-08-28: 4 mg via INTRAVENOUS

## 2022-08-28 MED ORDER — 0.9 % SODIUM CHLORIDE (POUR BTL) OPTIME
TOPICAL | Status: DC | PRN
  Administered 2022-08-28: 500 mL

## 2022-08-28 MED ORDER — ACETAMINOPHEN 10 MG/ML IV SOLN: 1000.0000 mg | Freq: Once | INTRAVENOUS | Status: DC | PRN

## 2022-08-28 MED ORDER — PHENYLEPHRINE 80 MCG/ML (10ML) SYRINGE FOR IV PUSH (FOR BLOOD PRESSURE SUPPORT)
PREFILLED_SYRINGE | INTRAVENOUS | Status: AC
  Filled 2022-08-28: qty 10

## 2022-08-28 MED ORDER — BUPIVACAINE HCL (PF) 0.25 % IJ SOLN
INTRAMUSCULAR | Status: AC
  Filled 2022-08-28: qty 30

## 2022-08-28 MED ORDER — LIDOCAINE HCL (PF) 2 % IJ SOLN
INTRAMUSCULAR | Status: AC
  Filled 2022-08-28: qty 5

## 2022-08-28 MED ORDER — FENTANYL CITRATE (PF) 100 MCG/2ML IJ SOLN
INTRAMUSCULAR | Status: DC | PRN
  Administered 2022-08-28 (×2): 25 ug via INTRAVENOUS
  Administered 2022-08-28: 50 ug via INTRAVENOUS
  Administered 2022-08-28: 25 ug via INTRAVENOUS

## 2022-08-28 MED ORDER — EPHEDRINE 5 MG/ML INJ
INTRAVENOUS | Status: AC
  Filled 2022-08-28: qty 5

## 2022-08-28 MED ORDER — ONDANSETRON HCL 4 MG/2ML IJ SOLN: 4.0000 mg | Freq: Four times a day (QID) | INTRAMUSCULAR | Status: DC | PRN

## 2022-08-28 MED ORDER — ONDANSETRON HCL 4 MG PO TABS: 4.0000 mg | ORAL_TABLET | Freq: Four times a day (QID) | ORAL | Status: DC | PRN

## 2022-08-28 MED ORDER — FENTANYL CITRATE (PF) 100 MCG/2ML IJ SOLN: 25.0000 ug | INTRAMUSCULAR | Status: DC | PRN

## 2022-08-28 MED ORDER — BUPIVACAINE LIPOSOME 1.3 % IJ SUSP
INTRAMUSCULAR | Status: DC | PRN
  Administered 2022-08-28: 20 mL via INTRAMUSCULAR

## 2022-08-28 MED ORDER — ONDANSETRON HCL 4 MG/2ML IJ SOLN
INTRAMUSCULAR | Status: AC
  Filled 2022-08-28: qty 2

## 2022-08-28 MED ORDER — LIDOCAINE HCL (PF) 1 % IJ SOLN
INTRAMUSCULAR | Status: AC
  Filled 2022-08-28: qty 30

## 2022-08-28 MED ORDER — HYDROCODONE-ACETAMINOPHEN 5-325 MG PO TABS: 1.0000 | ORAL_TABLET | Freq: Four times a day (QID) | ORAL | 0 refills | Status: AC | PRN

## 2022-08-28 MED ORDER — MIDAZOLAM HCL 2 MG/2ML IJ SOLN
INTRAMUSCULAR | Status: DC | PRN
  Administered 2022-08-28: 2 mg via INTRAVENOUS

## 2022-08-28 MED ORDER — PROPOFOL 10 MG/ML IV BOLUS
INTRAVENOUS | Status: DC | PRN
  Administered 2022-08-28: 150 mg via INTRAVENOUS

## 2022-08-28 MED ORDER — DEXMEDETOMIDINE HCL IN NACL 80 MCG/20ML IV SOLN
INTRAVENOUS | Status: DC | PRN
  Administered 2022-08-28: 8 ug via BUCCAL
  Administered 2022-08-28: 4 ug via BUCCAL

## 2022-08-28 MED ORDER — ASPIRIN 325 MG PO TBEC: 325.0000 mg | DELAYED_RELEASE_TABLET | Freq: Every day | ORAL | 0 refills | Status: AC

## 2022-08-28 MED ORDER — LIDOCAINE HCL (CARDIAC) PF 100 MG/5ML IV SOSY
PREFILLED_SYRINGE | INTRAVENOUS | Status: DC | PRN
  Administered 2022-08-28: 80 mg via INTRAVENOUS

## 2022-08-28 MED ORDER — DEXAMETHASONE SODIUM PHOSPHATE 10 MG/ML IJ SOLN
INTRAMUSCULAR | Status: DC | PRN
  Administered 2022-08-28: 10 mg via INTRAVENOUS

## 2022-08-28 MED ORDER — METOCLOPRAMIDE HCL 10 MG PO TABS: 5.0000 mg | ORAL_TABLET | Freq: Three times a day (TID) | ORAL | Status: DC | PRN

## 2022-08-28 MED ORDER — ONDANSETRON HCL 4 MG/2ML IJ SOLN: 4.0000 mg | Freq: Once | INTRAMUSCULAR | Status: DC | PRN

## 2022-08-28 SURGICAL SUPPLY — 61 items
BLADE MED AGGRESSIVE (BLADE) ×1 IMPLANT
BLADE SAW LAPIPLASTY 40X11 (BLADE) IMPLANT
BLADE SURG 15 STRL LF DISP TIS (BLADE) ×2 IMPLANT
BLADE SURG 15 STRL SS (BLADE) ×2
BNDG CMPR 75X21 PLY HI ABS (MISCELLANEOUS) ×1
BNDG CMPR STD VLCR NS LF 5.8X4 (GAUZE/BANDAGES/DRESSINGS) ×2
BNDG ELASTIC 4X5.8 VLCR NS LF (GAUZE/BANDAGES/DRESSINGS) ×2 IMPLANT
BNDG ESMARCH 4 X 12 STRL LF (GAUZE/BANDAGES/DRESSINGS) ×1
BNDG ESMARCH 4X12 STRL LF (GAUZE/BANDAGES/DRESSINGS) IMPLANT
BNDG GAUZE DERMACEA FLUFF 4 (GAUZE/BANDAGES/DRESSINGS) ×1 IMPLANT
BNDG GZE 12X3 1 PLY HI ABS (GAUZE/BANDAGES/DRESSINGS) ×1
BNDG GZE DERMACEA 4 6PLY (GAUZE/BANDAGES/DRESSINGS) ×1
BNDG STRETCH GAUZE 3IN X12FT (GAUZE/BANDAGES/DRESSINGS) ×1 IMPLANT
BOOT STEPPER DURA SM (SOFTGOODS) IMPLANT
BUR 4X45 EGG (BURR) ×1 IMPLANT
COVER PIN YLW 0.028-062 (MISCELLANEOUS) ×1 IMPLANT
CUFF TOURN SGL QUICK 12 (TOURNIQUET CUFF) IMPLANT
CUFF TOURN SGL QUICK 18X4 (TOURNIQUET CUFF) IMPLANT
DRAPE FLUOR MINI C-ARM 54X84 (DRAPES) ×1 IMPLANT
DURAPREP 26ML APPLICATOR (WOUND CARE) ×1 IMPLANT
ELECT REM PT RETURN 9FT ADLT (ELECTROSURGICAL) ×1
ELECTRODE REM PT RTRN 9FT ADLT (ELECTROSURGICAL) ×1 IMPLANT
GAUZE SPONGE 4X4 12PLY STRL (GAUZE/BANDAGES/DRESSINGS) ×1 IMPLANT
GAUZE STRETCH 2X75IN STRL (MISCELLANEOUS) ×1 IMPLANT
GAUZE XEROFORM 1X8 LF (GAUZE/BANDAGES/DRESSINGS) ×1 IMPLANT
GLOVE BIO SURGEON STRL SZ7.5 (GLOVE) ×1 IMPLANT
GLOVE INDICATOR 8.0 STRL GRN (GLOVE) ×1 IMPLANT
GOWN STRL REUS W/ TWL XL LVL3 (GOWN DISPOSABLE) ×2 IMPLANT
GOWN STRL REUS W/TWL XL LVL3 (GOWN DISPOSABLE) ×2
LABEL OR SOLS (LABEL) ×1 IMPLANT
LAPIPLASTY SYS 4A (Orthopedic Implant) ×1 IMPLANT
MANIFOLD NEPTUNE II (INSTRUMENTS) ×1 IMPLANT
NDL FILTER BLUNT 18X1 1/2 (NEEDLE) ×1 IMPLANT
NDL HYPO 22X1.5 SAFETY MO (MISCELLANEOUS) ×1 IMPLANT
NDL HYPO 25X1 1.5 SAFETY (NEEDLE) ×2 IMPLANT
NEEDLE FILTER BLUNT 18X1 1/2 (NEEDLE) ×1 IMPLANT
NEEDLE HYPO 22X1.5 SAFETY MO (MISCELLANEOUS) ×1 IMPLANT
NEEDLE HYPO 25X1 1.5 SAFETY (NEEDLE) ×2 IMPLANT
NS IRRIG 500ML POUR BTL (IV SOLUTION) ×1 IMPLANT
PACK EXTREMITY ARMC (MISCELLANEOUS) ×1 IMPLANT
RASP SM TEAR CROSS CUT (RASP) ×1 IMPLANT
SCREW 2.7 HIGH PITCH LOCKING (Screw) IMPLANT
SCREW HIGH PITCH LOCK 2.7 (Screw) IMPLANT
SPLINT CAST 1 STEP 4X30 (MISCELLANEOUS) ×1 IMPLANT
SPLINT PLASTER CAST FAST 5X30 (CAST SUPPLIES) ×1 IMPLANT
STOCKINETTE IMPERV 14X48 (MISCELLANEOUS) IMPLANT
STOCKINETTE M/LG 89821 (MISCELLANEOUS) ×1 IMPLANT
STRAP SAFETY 5IN WIDE (MISCELLANEOUS) ×1 IMPLANT
STRIP CLOSURE SKIN 1/4X4 (GAUZE/BANDAGES/DRESSINGS) ×1 IMPLANT
SUT ETHILON 3-0 FS-10 30 BLK (SUTURE) ×1
SUT MNCRL AB 3-0 PS2 27 (SUTURE) IMPLANT
SUT VIC AB 3-0 SH 27 (SUTURE) ×1
SUT VIC AB 3-0 SH 27X BRD (SUTURE) IMPLANT
SUT VIC AB 4-0 FS2 27 (SUTURE) ×1 IMPLANT
SUTURE EHLN 3-0 FS-10 30 BLK (SUTURE) IMPLANT
SYR 10ML LL (SYRINGE) ×1 IMPLANT
SYSTEM LAPIPLASTY 4A (Orthopedic Implant) IMPLANT
TRAP FLUID SMOKE EVACUATOR (MISCELLANEOUS) ×1 IMPLANT
WATER STERILE IRR 500ML POUR (IV SOLUTION) ×1 IMPLANT
WIRE Z .045 C-WIRE SPADE TIP (WIRE) ×2 IMPLANT
WIRE Z .062 C-WIRE SPADE TIP (WIRE) ×2 IMPLANT

## 2022-08-28 NOTE — Transfer of Care (Signed)
Immediate Anesthesia Transfer of Care Note  Patient: Samantha Wells  Procedure(s) Performed: Lillard Anes LAPIDUS TYPE (Left: Toe)  Patient Location: PACU  Anesthesia Type:General  Level of Consciousness: awake, drowsy, and patient cooperative  Airway & Oxygen Therapy: Patient Spontanous Breathing and Patient connected to face mask oxygen  Post-op Assessment: Report given to RN and Post -op Vital signs reviewed and stable  Post vital signs: Reviewed and stable  Last Vitals:  Vitals Value Taken Time  BP 123/70 08/28/22 1630  Temp    Pulse 91 08/28/22 1632  Resp 16 08/28/22 1632  SpO2 100 % 08/28/22 1632  Vitals shown include unvalidated device data.  Last Pain:  Vitals:   08/28/22 1221  TempSrc: Oral  PainSc: 0-No pain         Complications: No notable events documented.

## 2022-08-28 NOTE — H&P (Signed)
HISTORY AND PHYSICAL INTERVAL NOTE:  [id number]  [id number] PM  Samantha Wells  has presented today for surgery, with the diagnosis of M20.12 - Hallux valgus of left foot.  The various methods of treatment have been discussed with the patient.  No guarantees were given.  After consideration of risks, benefits and other options for treatment, the patient has consented to surgery.  I have reviewed the patients' chart and labs.     A history and physical examination was performed in my office.  The patient was reexamined.  There have been no changes to this history and physical examination.  Samantha Wells A

## 2022-08-28 NOTE — Anesthesia Postprocedure Evaluation (Signed)
Anesthesia Post Note  Patient: Samantha Wells  Procedure(s) Performed: BUNIONECTOMY LAPIDUS TYPE (Left: Toe)  Patient location during evaluation: PACU Anesthesia Type: General Level of consciousness: awake and alert Pain management: pain level controlled Vital Signs Assessment: post-procedure vital signs reviewed and stable Respiratory status: spontaneous breathing, nonlabored ventilation, respiratory function stable and patient connected to nasal cannula oxygen Cardiovascular status: blood pressure returned to baseline and stable Postop Assessment: no apparent nausea or vomiting Anesthetic complications: no   No notable events documented.   Last Vitals:  Vitals:   08/28/22 1645 08/28/22 1654  BP: 130/80 130/82  Pulse: 93 90  Resp: 17 14  Temp:  36.7 C  SpO2: 100% 97%    Last Pain:  Vitals:   08/28/22 1654  TempSrc: Temporal  PainSc:                  Ilene Qua

## 2022-08-28 NOTE — Op Note (Signed)
Operative note   Surgeon:Leith Szafranski Lawyer: None    Preop diagnosis: Hallux valgus deformity left foot    Postop diagnosis: Same    Procedure: Lapidus hallux valgus correction left foot  2.  Intraoperative fluoroscopy use without radiologist assistance    EBL: Minimal    Anesthesia:local and IV sedation.  Local consisted of a one-to-one mixture of 0.25% bupivacaine and Exparel long-acting anesthetic.  A total of 20 cc was used    Hemostasis: Midcalf tourniquet inflated to 200 mmHg for 75 minutes approximately    Specimen: None    Complications: None    Operative indications:Dynver Hubby is an 57 y.o. that presents today for surgical intervention.  The risks/benefits/alternatives/complications have been discussed and consent has been given.    Procedure:  Patient was brought into the OR and placed on the operating table in thesupine position. After anesthesia was obtained theleft lower extremity was prepped and draped in usual sterile fashion.  Attention was directed to the dorsal aspect of the foot where a dorsal incision was made at the first met cuneiforms joint.  Sharp and blunt dissection was carried down to the periosteum.  Subperiosteal dissection was then undertaken.  This exposed the first met cuneiform joint.  This was then freed and loosened.  A small fulcrum was placed between the base of the first metatarsal and second metatarsal.  Next the joint positioner was placed on the medial aspect of the metatarsal.  A small stab incision was made at the second metatarsal.  Compression was placed for the positioner.  Good realignment of the first intermetatarsal angle was noted at this time.  Attention was then directed to the dorsomedial first MTPJ where an incision was performed.  Sharp and blunt dissection was carried down to the capsule.  The intermetatarsal space was then entered.  The conjoined tendon of the abductor was then freed from the base of the proximal  phalanx.  Attention was to redirected to the first met cuneiform joint.  At this time the osteotomy cut guide was placed into the joint region.  2 vertical cuts were then made.  The cartilage material was removed from the first met cuneiform joint and the joint was then prepped with a 2.0 mm drill bit.  The joint compressor was then placed.  Good compression and realignment was noted.  Next the medial and dorsal locking plates were then placed from the [place] set.  Realignment and stability was noted in all planes.  Attention was redirected to the dorsomedial first MTPJ.  A small T capsulotomy was performed.  The dorsomedial eminence was noted and transected and smoothed with a power rasp.  A small capsulorrhaphy was performed.  Closure was then performed after all areas were irrigated.  3-0 Vicryl for the capsular tissue.  4-0 Vicryl in subcutaneous tissue and a 4-0 Monocryl for the skin.   Further local anesthesia was performed at the end of the case.    Patient tolerated the procedure and anesthesia well.  Was transported from the OR to the PACU with all vital signs stable and vascular status intact. To be discharged per routine protocol.  Will follow up in approximately 1 week in the outpatient clinic.

## 2022-08-28 NOTE — Discharge Instructions (Addendum)
[place]  POST OPERATIVE INSTRUCTIONS FOR DR. Vickki Muff AND DR. Padroni   Take your medication as prescribed.  Pain medication should be taken only as needed.  I have recommended taking a aspirin daily until further notice.  Keep the dressing clean, dry and intact.  Keep your foot elevated above the heart level for the first 48 hours.  We have instructed you to be non-weight bearing.  Always wear your post-op shoe when walking.  Always use your crutches if you are to be non-weight bearing.  Do not take a shower. Baths are permissible as long as the foot is kept out of the water.   Every hour you are awake:  Bend your knee 15 times.  Call [facility] [phone]) if any of the following problems occur: You develop a temperature or fever. The bandage becomes saturated with blood. Medication does not stop your pain. Injury of the foot occurs. Any symptoms of infection including redness, odor, or red streaks running from wound.  Information for Discharge Teaching:  DO NOT REMOVE TEAL EXPAREL BRACELET FOR 4 DAYS, (96 hours) 09/01/2022 EXPAREL (bupivacaine liposome injectable suspension)   Your surgeon or anesthesiologist gave you EXPAREL(bupivacaine) to help control your pain after surgery.  EXPAREL is a local anesthetic that provides pain relief by numbing the tissue around the surgical site. EXPAREL is designed to release pain medication over time and can control pain for up to 72 hours. Depending on how you respond to EXPAREL, you may require less pain medication during your recovery.  Possible side effects: Temporary loss of sensation or ability to move in the area where bupivacaine was injected. Nausea, vomiting, constipation Rarely, numbness and tingling in your mouth or lips, lightheadedness, or anxiety may occur. Call your doctor right away if you think you may be experiencing any of these  sensations, or if you have other questions regarding possible side effects.  Follow all other discharge instructions given to you by your surgeon or nurse. Eat a healthy diet and drink plenty of water or other fluids.  If you return to the hospital for any reason within 96 hours following the administration of EXPAREL, it is important for health care providers to know that you have received this anesthetic. A teal colored band has been placed on your arm with the date, time and amount of EXPAREL you have received in order to alert and inform your health care providers. Please leave this armband in place for the full 96 hours following administration, and then you may remove the band.   AMBULATORY SURGERY  DISCHARGE INSTRUCTIONS   The drugs that you were given will stay in your system until tomorrow so for the next 24 hours you should not:  Drive an automobile Make any legal decisions Drink any alcoholic beverage   You may resume regular meals tomorrow.  Today it is better to start with liquids and gradually work up to solid foods.  You may eat anything you prefer, but it is better to start with liquids, then soup and crackers, and gradually work up to solid foods.   Please notify your doctor immediately if you have any unusual bleeding, trouble breathing, redness and pain at the surgery site, drainage, fever, or pain not relieved by medication.   Please contact your physician with any problems or Same Day Surgery at [phone], Monday through Friday 6 am to 4 pm, or [place] at [facility] number at [phone].

## 2022-08-28 NOTE — Anesthesia Procedure Notes (Signed)
Procedure Name: LMA Insertion Date/Time: 08/28/2022 2:41 PM  Performed by: Jerrye Noble, CRNAPre-anesthesia Checklist: Patient identified, Emergency Drugs available, Suction available and Patient being monitored Patient Re-evaluated:Patient Re-evaluated prior to induction Oxygen Delivery Method: Circle system utilized Preoxygenation: Pre-oxygenation with 100% oxygen Induction Type: IV induction LMA: LMA inserted LMA Size: 4.0 Number of attempts: 1 Placement Confirmation: positive ETCO2 and breath sounds checked- equal and bilateral Dental Injury: Teeth and Oropharynx as per pre-operative assessment

## 2022-08-28 NOTE — Anesthesia Preprocedure Evaluation (Signed)
Anesthesia Evaluation  Patient identified by MRN, date of birth, ID band Patient awake    Reviewed: Allergy & Precautions, NPO status , Patient's Chart, lab work & pertinent test results  History of Anesthesia Complications Negative for: history of anesthetic complications  Airway Mallampati: II  TM Distance: >3 FB Neck ROM: Full    Dental no notable dental hx. (+) Teeth Intact   Pulmonary neg pulmonary ROS, neg sleep apnea, neg COPD, Patient abstained from smoking.Not current smoker, former smoker   Pulmonary exam normal breath sounds clear to auscultation       Cardiovascular Exercise Tolerance: Good METShypertension, Pt. on medications (-) CAD and (-) Past MI (-) dysrhythmias  Rhythm:Regular Rate:Normal - Systolic murmurs    Neuro/Psych negative neurological ROS  negative psych ROS   GI/Hepatic ,GERD  Medicated and Controlled,,(+)     (-) substance abuse    Endo/Other  neg diabetes    Renal/GU negative Renal ROS     Musculoskeletal  (+) Arthritis ,    Abdominal  (+) + obese  Peds  Hematology   Anesthesia Other Findings Past Medical History: No date: Arthritis No date: GERD (gastroesophageal reflux disease) No date: Hypertension  Reproductive/Obstetrics                             Anesthesia Physical Anesthesia Plan  ASA: 2  Anesthesia Plan: General   Post-op Pain Management: Ofirmev IV (intra-op)*   Induction: Intravenous  PONV Risk Score and Plan: 3 and Ondansetron, Dexamethasone and Midazolam  Airway Management Planned: LMA  Additional Equipment: None  Intra-op Plan:   Post-operative Plan: Extubation in OR  Informed Consent: I have reviewed the patients History and Physical, chart, labs and discussed the procedure including the risks, benefits and alternatives for the proposed anesthesia with the patient or authorized representative who has indicated his/her  understanding and acceptance.     Dental advisory given  Plan Discussed with: CRNA and Surgeon  Anesthesia Plan Comments: (Discussed risks of anesthesia with patient, including PONV, sore throat, lip/dental/eye damage. Rare risks discussed as well, such as cardiorespiratory and neurological sequelae, and allergic reactions. Discussed the role of CRNA in patient's perioperative care. Patient understands.)       Anesthesia Quick Evaluation

## 2022-08-29 ENCOUNTER — Encounter: Payer: Self-pay | Admitting: Podiatry

## 2022-09-29 IMAGING — DX DG KNEE COMPLETE 4+V*L*
4 series · 4 of 4 positions shown · non-contrast
Comparison: Radiograph 08/20/2019

CLINICAL DATA: Left knee pain.  Leg pain and swelling for 2 days.

EXAM:
LEFT KNEE - COMPLETE 4+ VIEW

[knee ap]
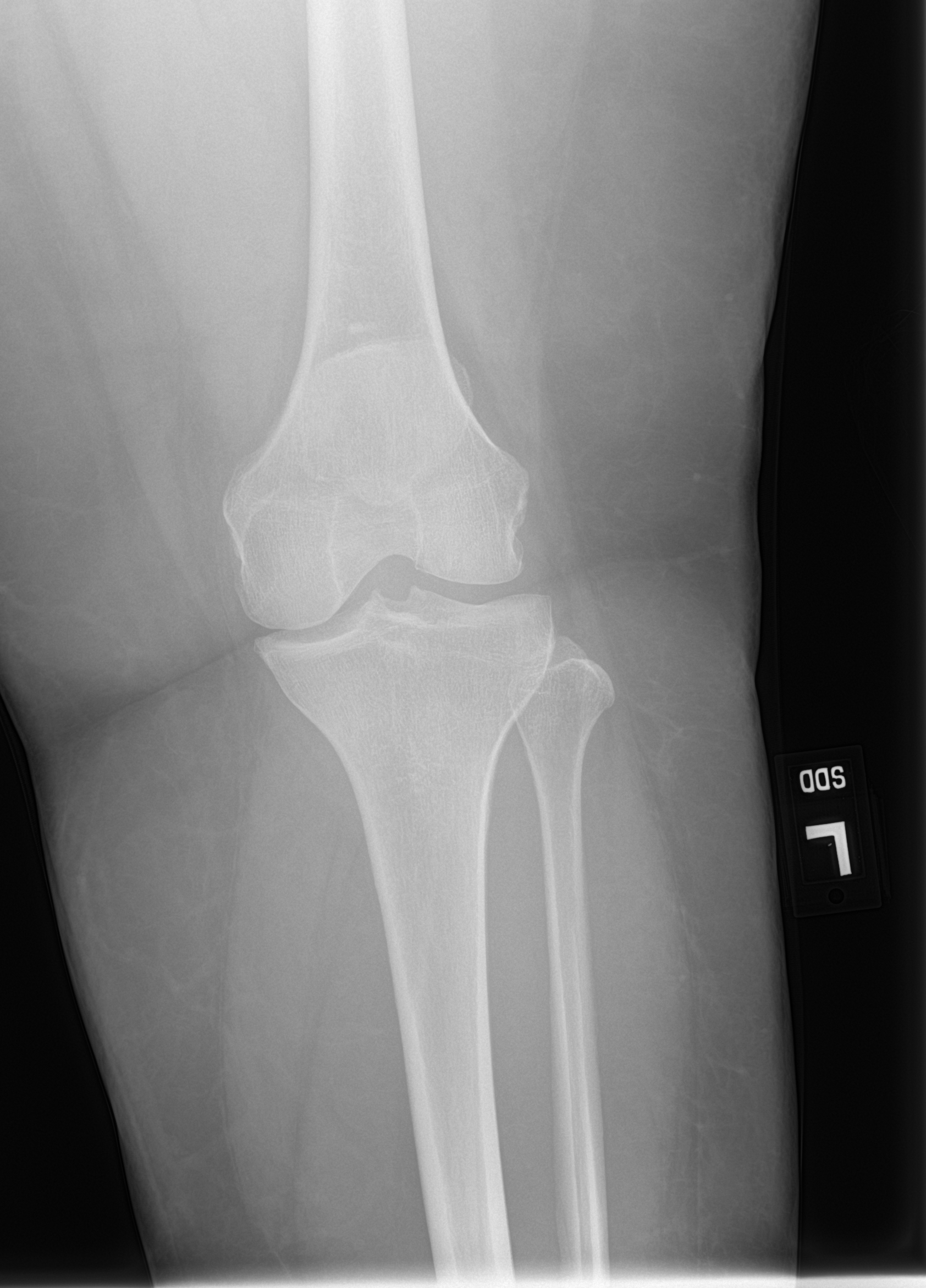

[knee lat]
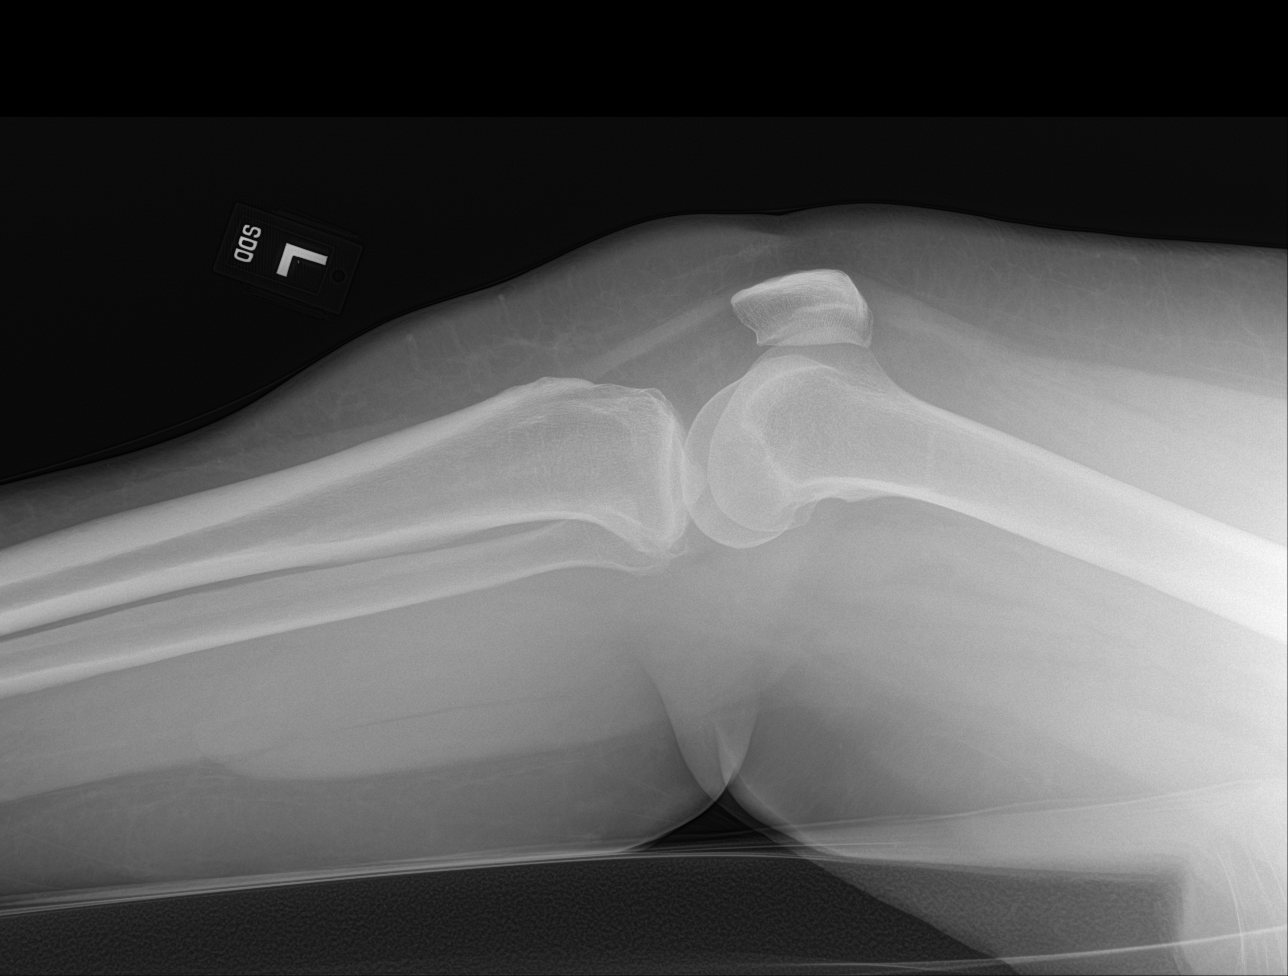

[knee obl (1 of 2)]
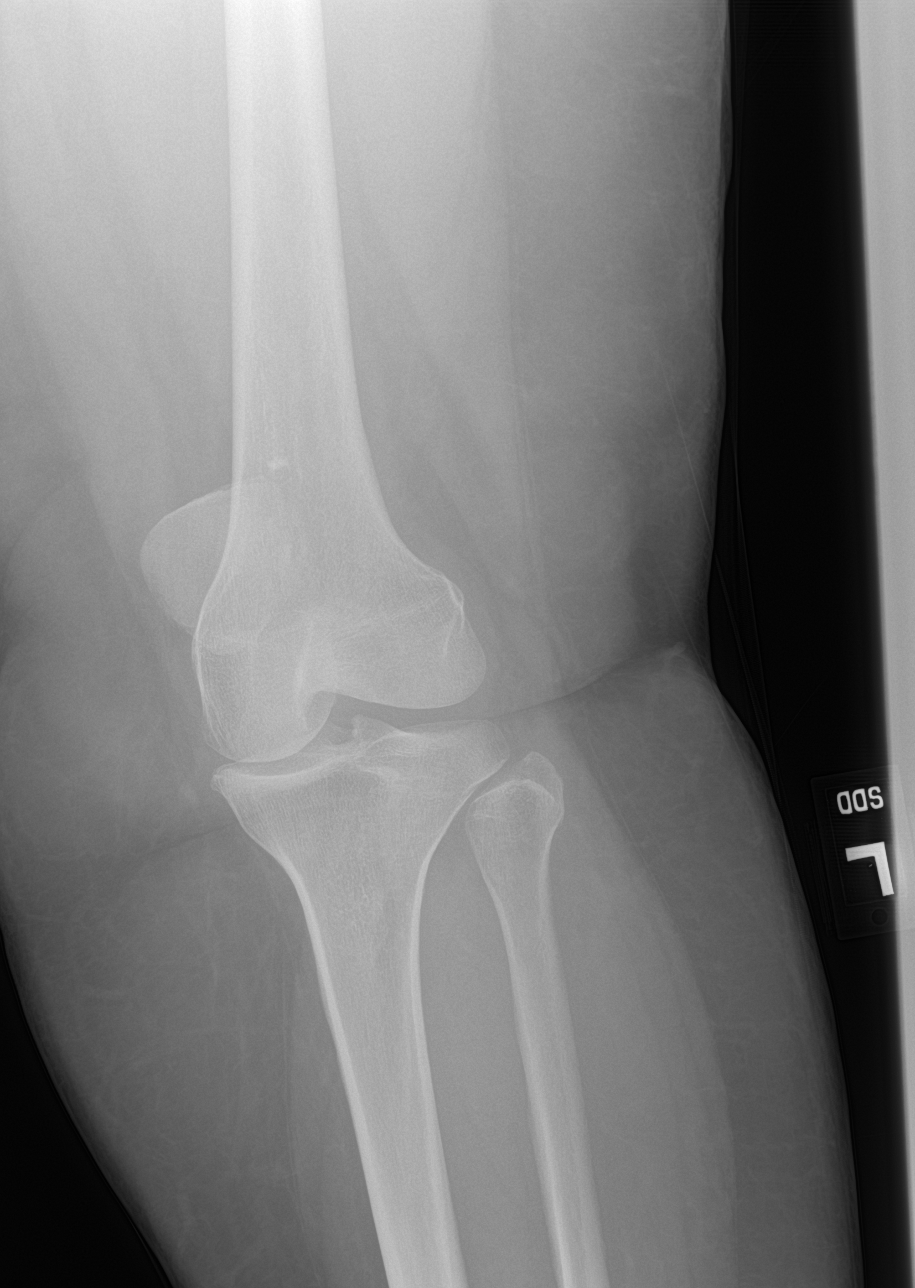

[knee obl (2 of 2)]
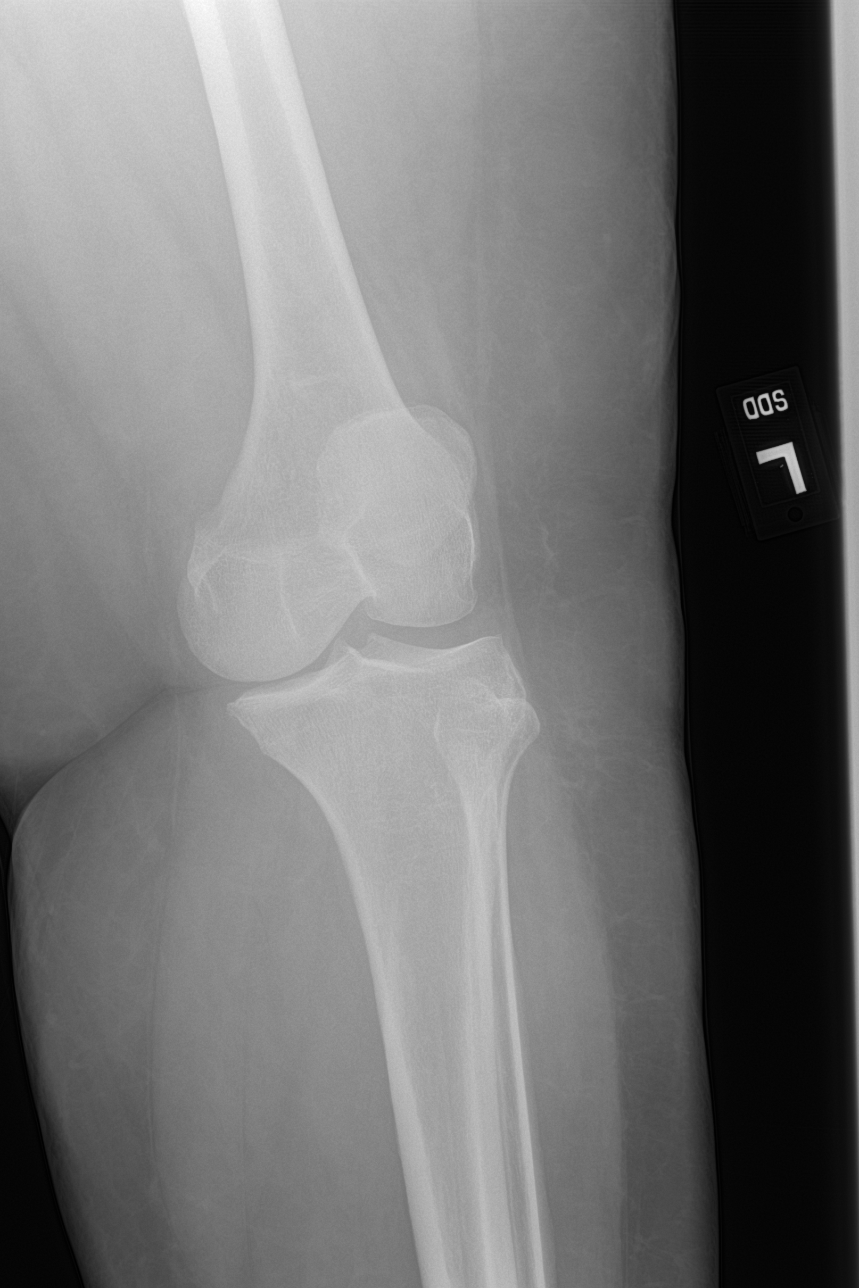

[4 of 4 positions shown; findings below may reference images not displayed]

FINDINGS: No evidence of fracture, dislocation, or joint effusion. Mild
peripheral spurring is unchanged from prior exam, most prominent in
the medial tibiofemoral compartment. Soft tissues are unremarkable.
IMPRESSION: No acute findings. Mild degenerative peripheral spurring, unchanged
from prior exam.
